# Patient Record
Sex: Female | Born: 1980 | Race: White | Hispanic: No | Marital: Married | Smoking: Former smoker
Health system: Southern US, Community
[De-identification: ages and names within clinical notes are randomized; demographics above are authoritative.]

## PROBLEM LIST (undated history)

## (undated) ENCOUNTER — Inpatient Hospital Stay (HOSPITAL_COMMUNITY): Payer: Self-pay

## (undated) DIAGNOSIS — D649 Anemia, unspecified: Secondary | ICD-10-CM

## (undated) DIAGNOSIS — N83209 Unspecified ovarian cyst, unspecified side: Secondary | ICD-10-CM

## (undated) DIAGNOSIS — N809 Endometriosis, unspecified: Secondary | ICD-10-CM

## (undated) DIAGNOSIS — Z9889 Other specified postprocedural states: Secondary | ICD-10-CM

## (undated) DIAGNOSIS — T4145XA Adverse effect of unspecified anesthetic, initial encounter: Secondary | ICD-10-CM

## (undated) DIAGNOSIS — F32A Depression, unspecified: Secondary | ICD-10-CM

## (undated) DIAGNOSIS — F329 Major depressive disorder, single episode, unspecified: Secondary | ICD-10-CM

## (undated) DIAGNOSIS — E063 Autoimmune thyroiditis: Secondary | ICD-10-CM

## (undated) DIAGNOSIS — T8859XA Other complications of anesthesia, initial encounter: Secondary | ICD-10-CM

## (undated) DIAGNOSIS — K900 Celiac disease: Secondary | ICD-10-CM

## (undated) DIAGNOSIS — B999 Unspecified infectious disease: Secondary | ICD-10-CM

## (undated) DIAGNOSIS — R809 Proteinuria, unspecified: Secondary | ICD-10-CM

## (undated) DIAGNOSIS — N209 Urinary calculus, unspecified: Secondary | ICD-10-CM

## (undated) DIAGNOSIS — R112 Nausea with vomiting, unspecified: Secondary | ICD-10-CM

## (undated) DIAGNOSIS — R87629 Unspecified abnormal cytological findings in specimens from vagina: Secondary | ICD-10-CM

## (undated) DIAGNOSIS — R51 Headache: Secondary | ICD-10-CM

## (undated) DIAGNOSIS — E0590 Thyrotoxicosis, unspecified without thyrotoxic crisis or storm: Secondary | ICD-10-CM

## (undated) DIAGNOSIS — N200 Calculus of kidney: Secondary | ICD-10-CM

## (undated) HISTORY — PX: NASAL SINUS SURGERY: SHX719

## (undated) HISTORY — DX: Headache: R51

## (undated) HISTORY — PX: TONSILLECTOMY: SUR1361

## (undated) HISTORY — PX: LAPAROSCOPIC ENDOMETRIOSIS FULGURATION: SUR769

## (undated) HISTORY — DX: Urinary calculus, unspecified: N20.9

## (undated) HISTORY — DX: Proteinuria, unspecified: R80.9

---

## 2008-02-29 ENCOUNTER — Encounter: Admission: RE | Admit: 2008-02-29 | Discharge: 2008-02-29 | Payer: Self-pay | Admitting: Emergency Medicine

## 2008-03-31 DIAGNOSIS — R809 Proteinuria, unspecified: Secondary | ICD-10-CM

## 2008-03-31 HISTORY — DX: Proteinuria, unspecified: R80.9

## 2008-06-07 ENCOUNTER — Encounter: Admission: RE | Admit: 2008-06-07 | Discharge: 2008-06-07 | Payer: Self-pay | Admitting: Emergency Medicine

## 2008-07-27 ENCOUNTER — Encounter (INDEPENDENT_AMBULATORY_CARE_PROVIDER_SITE_OTHER): Payer: Self-pay | Admitting: *Deleted

## 2009-02-12 ENCOUNTER — Inpatient Hospital Stay (HOSPITAL_COMMUNITY): Admission: AD | Admit: 2009-02-12 | Discharge: 2009-02-13 | Payer: Self-pay | Admitting: Obstetrics and Gynecology

## 2009-03-20 ENCOUNTER — Inpatient Hospital Stay (HOSPITAL_COMMUNITY): Admission: AD | Admit: 2009-03-20 | Discharge: 2009-03-20 | Payer: Self-pay | Admitting: Obstetrics and Gynecology

## 2009-03-28 ENCOUNTER — Ambulatory Visit (HOSPITAL_COMMUNITY): Admission: RE | Admit: 2009-03-28 | Discharge: 2009-03-28 | Payer: Self-pay | Admitting: Obstetrics and Gynecology

## 2009-04-18 ENCOUNTER — Inpatient Hospital Stay (HOSPITAL_COMMUNITY): Admission: AD | Admit: 2009-04-18 | Discharge: 2009-04-18 | Payer: Self-pay | Admitting: Obstetrics and Gynecology

## 2009-04-30 ENCOUNTER — Inpatient Hospital Stay (HOSPITAL_COMMUNITY): Admission: AD | Admit: 2009-04-30 | Discharge: 2009-04-30 | Payer: Self-pay | Admitting: Obstetrics and Gynecology

## 2009-05-14 ENCOUNTER — Inpatient Hospital Stay (HOSPITAL_COMMUNITY): Admission: AD | Admit: 2009-05-14 | Discharge: 2009-05-19 | Payer: Self-pay | Admitting: Obstetrics and Gynecology

## 2009-08-20 ENCOUNTER — Encounter: Admission: RE | Admit: 2009-08-20 | Discharge: 2009-09-19 | Payer: Self-pay | Admitting: Obstetrics and Gynecology

## 2009-09-20 ENCOUNTER — Encounter: Admission: RE | Admit: 2009-09-20 | Discharge: 2009-09-27 | Payer: Self-pay | Admitting: Obstetrics and Gynecology

## 2009-10-21 ENCOUNTER — Encounter: Admission: RE | Admit: 2009-10-21 | Discharge: 2009-11-20 | Payer: Self-pay | Admitting: Obstetrics and Gynecology

## 2009-11-21 ENCOUNTER — Encounter: Admission: RE | Admit: 2009-11-21 | Discharge: 2009-12-21 | Payer: Self-pay | Admitting: Obstetrics and Gynecology

## 2009-12-22 ENCOUNTER — Encounter: Admission: RE | Admit: 2009-12-22 | Discharge: 2010-01-21 | Payer: Self-pay | Admitting: Obstetrics and Gynecology

## 2010-01-22 ENCOUNTER — Encounter
Admission: RE | Admit: 2010-01-22 | Discharge: 2010-02-21 | Payer: Self-pay | Source: Home / Self Care | Admitting: Obstetrics and Gynecology

## 2010-02-22 ENCOUNTER — Encounter
Admission: RE | Admit: 2010-02-22 | Discharge: 2010-03-24 | Payer: Self-pay | Source: Home / Self Care | Attending: Obstetrics and Gynecology | Admitting: Obstetrics and Gynecology

## 2010-03-07 ENCOUNTER — Inpatient Hospital Stay (HOSPITAL_COMMUNITY): Admission: AD | Admit: 2010-03-07 | Discharge: 2009-04-03 | Payer: Self-pay | Admitting: Obstetrics and Gynecology

## 2010-03-25 ENCOUNTER — Encounter
Admission: RE | Admit: 2010-03-25 | Discharge: 2010-04-24 | Payer: Self-pay | Source: Home / Self Care | Attending: Obstetrics and Gynecology | Admitting: Obstetrics and Gynecology

## 2010-04-25 ENCOUNTER — Encounter
Admission: RE | Admit: 2010-04-25 | Discharge: 2010-04-30 | Payer: Self-pay | Source: Home / Self Care | Attending: Obstetrics and Gynecology | Admitting: Obstetrics and Gynecology

## 2010-05-26 ENCOUNTER — Encounter (HOSPITAL_COMMUNITY)
Admission: RE | Admit: 2010-05-26 | Discharge: 2010-05-26 | Disposition: A | Discharge status: Home / Self Care | Payer: Self-pay | Source: Ambulatory Visit | Attending: Obstetrics and Gynecology | Admitting: Obstetrics and Gynecology

## 2010-05-26 DIAGNOSIS — O923 Agalactia: Secondary | ICD-10-CM | POA: Insufficient documentation

## 2010-06-16 LAB — URINALYSIS, ROUTINE W REFLEX MICROSCOPIC
Bilirubin Urine: NEGATIVE
Bilirubin Urine: NEGATIVE
Glucose, UA: NEGATIVE mg/dL
Hgb urine dipstick: NEGATIVE
Ketones, ur: NEGATIVE mg/dL
Leukocytes, UA: NEGATIVE
Nitrite: NEGATIVE
Nitrite: NEGATIVE
Specific Gravity, Urine: 1.01 (ref 1.005–1.030)
Urobilinogen, UA: 0.2 mg/dL (ref 0.0–1.0)

## 2010-06-16 LAB — COMPREHENSIVE METABOLIC PANEL
ALT: 14 U/L (ref 0–35)
AST: 24 U/L (ref 0–37)
AST: 25 U/L (ref 0–37)
Albumin: 2.2 g/dL — ABNORMAL LOW (ref 3.5–5.2)
Albumin: 2.4 g/dL — ABNORMAL LOW (ref 3.5–5.2)
Alkaline Phosphatase: 142 U/L — ABNORMAL HIGH (ref 39–117)
BUN: 5 mg/dL — ABNORMAL LOW (ref 6–23)
CO2: 21 mEq/L (ref 19–32)
Chloride: 109 mEq/L (ref 96–112)
Creatinine, Ser: 0.49 mg/dL (ref 0.4–1.2)
Potassium: 3.7 mEq/L (ref 3.5–5.1)
Sodium: 134 mEq/L — ABNORMAL LOW (ref 135–145)
Sodium: 137 mEq/L (ref 135–145)
Total Bilirubin: 0.2 mg/dL — ABNORMAL LOW (ref 0.3–1.2)
Total Bilirubin: 0.5 mg/dL (ref 0.3–1.2)

## 2010-06-16 LAB — CBC
HCT: 30.3 % — ABNORMAL LOW (ref 36.0–46.0)
HCT: 33 % — ABNORMAL LOW (ref 36.0–46.0)
Hemoglobin: 10.9 g/dL — ABNORMAL LOW (ref 12.0–15.0)
MCHC: 33.5 g/dL (ref 30.0–36.0)
Platelets: 223 10*3/uL (ref 150–400)
Platelets: 260 10*3/uL (ref 150–400)
RDW: 13.3 % (ref 11.5–15.5)
RDW: 13.6 % (ref 11.5–15.5)
WBC: 10.3 10*3/uL (ref 4.0–10.5)

## 2010-06-16 LAB — WET PREP, GENITAL

## 2010-06-16 LAB — URINE MICROSCOPIC-ADD ON

## 2010-06-19 LAB — CBC
HCT: 24.3 % — ABNORMAL LOW (ref 36.0–46.0)
HCT: 31.4 % — ABNORMAL LOW (ref 36.0–46.0)
Hemoglobin: 10.4 g/dL — ABNORMAL LOW (ref 12.0–15.0)
MCHC: 33.2 g/dL (ref 30.0–36.0)
MCV: 89.9 fL (ref 78.0–100.0)
MCV: 90 fL (ref 78.0–100.0)
Platelets: 169 10*3/uL (ref 150–400)
Platelets: 255 10*3/uL (ref 150–400)
RBC: 2.71 MIL/uL — ABNORMAL LOW (ref 3.87–5.11)
RDW: 14.5 % (ref 11.5–15.5)
WBC: 9.9 10*3/uL (ref 4.0–10.5)

## 2010-06-19 LAB — COMPREHENSIVE METABOLIC PANEL
ALT: 15 U/L (ref 0–35)
AST: 25 U/L (ref 0–37)
Calcium: 7.8 mg/dL — ABNORMAL LOW (ref 8.4–10.5)
GFR calc Af Amer: >60 mL/min (ref >60)
GFR calc non Af Amer: >60 mL/min (ref >60)
Total Bilirubin: 0.7 mg/dL (ref 0.3–1.2)

## 2010-06-19 LAB — URIC ACID: Uric Acid, Serum: 5.2 mg/dL (ref 2.4–7.0)

## 2010-06-26 ENCOUNTER — Encounter (HOSPITAL_COMMUNITY)
Admission: RE | Admit: 2010-06-26 | Discharge: 2010-06-26 | Disposition: A | Discharge status: Home / Self Care | Payer: Self-pay | Source: Ambulatory Visit | Attending: Obstetrics and Gynecology | Admitting: Obstetrics and Gynecology

## 2010-06-26 DIAGNOSIS — O923 Agalactia: Secondary | ICD-10-CM | POA: Insufficient documentation

## 2010-07-01 LAB — URIC ACID: Uric Acid, Serum: 4.3 mg/dL (ref 2.4–7.0)

## 2010-07-01 LAB — COMPREHENSIVE METABOLIC PANEL
BUN: 7 mg/dL (ref 6–23)
Calcium: 7.8 mg/dL — ABNORMAL LOW (ref 8.4–10.5)
Glucose, Bld: 103 mg/dL — ABNORMAL HIGH (ref 70–99)
Total Protein: 5.2 g/dL — ABNORMAL LOW (ref 6.0–8.3)

## 2010-07-01 LAB — CBC
HCT: 33.3 % — ABNORMAL LOW (ref 36.0–46.0)
Hemoglobin: 11.3 g/dL — ABNORMAL LOW (ref 12.0–15.0)
MCHC: 33.8 g/dL (ref 30.0–36.0)
RDW: 12.3 % (ref 11.5–15.5)

## 2010-07-01 LAB — LACTATE DEHYDROGENASE: LDH: 139 U/L (ref 94–250)

## 2010-07-03 LAB — URINALYSIS, ROUTINE W REFLEX MICROSCOPIC
Glucose, UA: NEGATIVE mg/dL
Specific Gravity, Urine: 1.02 (ref 1.005–1.030)
Urobilinogen, UA: 0.2 mg/dL (ref 0.0–1.0)

## 2010-07-03 LAB — URINE MICROSCOPIC-ADD ON

## 2010-07-03 LAB — CBC
Hemoglobin: 9.8 g/dL — ABNORMAL LOW (ref 12.0–15.0)
MCV: 96.2 fL (ref 78.0–100.0)
Platelets: 235 10*3/uL (ref 150–400)
RBC: 2.99 MIL/uL — ABNORMAL LOW (ref 3.87–5.11)
WBC: 9.3 10*3/uL (ref 4.0–10.5)
WBC: 14.9 10*3/uL — ABNORMAL HIGH (ref 4.0–10.5)

## 2010-07-03 LAB — COMPREHENSIVE METABOLIC PANEL
AST: 25 U/L (ref 0–37)
Albumin: 2.7 g/dL — ABNORMAL LOW (ref 3.5–5.2)
Chloride: 109 mEq/L (ref 96–112)
Creatinine, Ser: 0.57 mg/dL (ref 0.4–1.2)
GFR calc Af Amer: >60 mL/min (ref >60)
Total Bilirubin: 0.3 mg/dL (ref 0.3–1.2)
Total Protein: 5.4 g/dL — ABNORMAL LOW (ref 6.0–8.3)

## 2010-07-27 ENCOUNTER — Encounter (HOSPITAL_COMMUNITY)
Admission: RE | Admit: 2010-07-27 | Discharge: 2010-07-27 | Disposition: A | Discharge status: Home / Self Care | Payer: Self-pay | Source: Ambulatory Visit | Attending: Obstetrics and Gynecology | Admitting: Obstetrics and Gynecology

## 2010-07-27 DIAGNOSIS — O923 Agalactia: Secondary | ICD-10-CM | POA: Insufficient documentation

## 2010-08-27 ENCOUNTER — Encounter (HOSPITAL_COMMUNITY)
Admission: RE | Admit: 2010-08-27 | Discharge: 2010-08-27 | Disposition: A | Discharge status: Home / Self Care | Payer: Self-pay | Source: Ambulatory Visit | Attending: Obstetrics and Gynecology | Admitting: Obstetrics and Gynecology

## 2010-08-27 DIAGNOSIS — O923 Agalactia: Secondary | ICD-10-CM | POA: Insufficient documentation

## 2010-09-27 ENCOUNTER — Encounter (HOSPITAL_COMMUNITY)
Admission: RE | Admit: 2010-09-27 | Discharge: 2010-09-27 | Disposition: A | Discharge status: Home / Self Care | Payer: Self-pay | Source: Ambulatory Visit | Attending: Obstetrics and Gynecology | Admitting: Obstetrics and Gynecology

## 2010-09-27 DIAGNOSIS — O923 Agalactia: Secondary | ICD-10-CM | POA: Insufficient documentation

## 2010-10-27 ENCOUNTER — Inpatient Hospital Stay (HOSPITAL_COMMUNITY): Payer: BC Managed Care – PPO

## 2010-10-27 ENCOUNTER — Other Ambulatory Visit: Payer: Self-pay | Admitting: Obstetrics and Gynecology

## 2010-10-27 ENCOUNTER — Inpatient Hospital Stay (HOSPITAL_COMMUNITY)
Admission: AD | Admit: 2010-10-27 | Discharge: 2010-10-27 | Disposition: A | Discharge status: Home / Self Care | Payer: BC Managed Care – PPO | Source: Ambulatory Visit | Attending: Obstetrics and Gynecology | Admitting: Obstetrics and Gynecology

## 2010-10-27 DIAGNOSIS — N949 Unspecified condition associated with female genital organs and menstrual cycle: Secondary | ICD-10-CM | POA: Insufficient documentation

## 2010-10-27 DIAGNOSIS — R1110 Vomiting, unspecified: Secondary | ICD-10-CM | POA: Insufficient documentation

## 2010-10-27 DIAGNOSIS — R112 Nausea with vomiting, unspecified: Secondary | ICD-10-CM | POA: Insufficient documentation

## 2010-10-27 DIAGNOSIS — M545 Low back pain, unspecified: Secondary | ICD-10-CM | POA: Insufficient documentation

## 2010-10-27 DIAGNOSIS — R6883 Chills (without fever): Secondary | ICD-10-CM | POA: Insufficient documentation

## 2010-10-27 DIAGNOSIS — R102 Pelvic and perineal pain: Secondary | ICD-10-CM

## 2010-10-27 DIAGNOSIS — R231 Pallor: Secondary | ICD-10-CM | POA: Insufficient documentation

## 2010-10-27 DIAGNOSIS — R61 Generalized hyperhidrosis: Secondary | ICD-10-CM | POA: Insufficient documentation

## 2010-10-27 LAB — COMPREHENSIVE METABOLIC PANEL
ALT: 14 U/L (ref 0–35)
Alkaline Phosphatase: 57 U/L (ref 39–117)
BUN: 10 mg/dL (ref 6–23)
CO2: 27 mEq/L (ref 19–32)
Calcium: 9 mg/dL (ref 8.4–10.5)
GFR calc Af Amer: >60 mL/min (ref >60)
GFR calc non Af Amer: >60 mL/min (ref >60)
Glucose, Bld: 199 mg/dL — ABNORMAL HIGH (ref 70–99)
Potassium: 3.4 mEq/L — ABNORMAL LOW (ref 3.5–5.1)
Total Protein: 6.3 g/dL (ref 6.0–8.3)

## 2010-10-27 LAB — DIFFERENTIAL
Eosinophils Absolute: 0 10*3/uL (ref 0.0–0.7)
Eosinophils Relative: 0 % (ref 0–5)
Lymphocytes Relative: 10 % — ABNORMAL LOW (ref 12–46)
Lymphs Abs: 0.6 10*3/uL — ABNORMAL LOW (ref 0.7–4.0)
Monocytes Relative: 10 % (ref 3–12)

## 2010-10-27 LAB — CBC
Hemoglobin: 12.5 g/dL (ref 12.0–15.0)
MCH: 30.9 pg (ref 26.0–34.0)
MCV: 90.1 fL (ref 78.0–100.0)
RBC: 4.05 MIL/uL (ref 3.87–5.11)
WBC: 5.6 10*3/uL (ref 4.0–10.5)

## 2010-10-27 LAB — URINALYSIS, ROUTINE W REFLEX MICROSCOPIC
Bilirubin Urine: NEGATIVE
Glucose, UA: NEGATIVE mg/dL
Hgb urine dipstick: NEGATIVE
Specific Gravity, Urine: 1.02 (ref 1.005–1.030)
pH: 8 (ref 5.0–8.0)

## 2010-10-27 LAB — WET PREP, GENITAL
Clue Cells Wet Prep HPF POC: NONE SEEN
Trich, Wet Prep: NONE SEEN
Yeast Wet Prep HPF POC: NONE SEEN

## 2010-10-27 LAB — LIPASE, BLOOD: Lipase: 19 U/L (ref 11–59)

## 2010-10-27 LAB — AMYLASE: Amylase: 36 U/L (ref 0–105)

## 2010-10-27 MED ORDER — IOHEXOL 300 MG/ML  SOLN
100.0000 mL | Freq: Once | INTRAMUSCULAR | Status: AC | PRN
  Administered 2010-10-27: 100 mL via INTRAVENOUS

## 2010-10-27 MED ORDER — HYDROMORPHONE HCL 1 MG/ML IJ SOLN
0.6000 mg | INTRAMUSCULAR | Status: DC | PRN
  Administered 2010-10-27: 0.6 mg via INTRAVENOUS
  Administered 2010-10-27: 17:00:00 via INTRAVENOUS
  Filled 2010-10-27 (×2): qty 1

## 2010-10-27 MED ORDER — PROMETHAZINE HCL 25 MG PO TABS: 25.0000 mg | ORAL_TABLET | Freq: Four times a day (QID) | ORAL | Status: AC | PRN

## 2010-10-27 MED ORDER — ONDANSETRON HCL 4 MG/2ML IJ SOLN
4.0000 mg | Freq: Once | INTRAMUSCULAR | Status: AC
  Administered 2010-10-27: 4 mg via INTRAVENOUS
  Filled 2010-10-27: qty 2

## 2010-10-27 MED ORDER — PROMETHAZINE HCL 25 MG/ML IJ SOLN
25.0000 mg | INTRAMUSCULAR | Status: DC | PRN
  Administered 2010-10-27: 25 mg via INTRAVENOUS
  Filled 2010-10-27: qty 1

## 2010-10-27 MED ORDER — MORPHINE SULFATE 4 MG/ML IJ SOLN: 4.0000 mg | INTRAMUSCULAR | Status: DC | PRN

## 2010-10-27 MED ORDER — SODIUM CHLORIDE 0.9 % IV SOLN
INTRAVENOUS | Status: DC
  Administered 2010-10-27: 19:00:00 via INTRAVENOUS

## 2010-10-27 MED ORDER — METOCLOPRAMIDE HCL 5 MG/ML IJ SOLN
10.0000 mg | Freq: Four times a day (QID) | INTRAMUSCULAR | Status: DC | PRN
  Administered 2010-10-27: 10 mg via INTRAVENOUS
  Filled 2010-10-27: qty 2

## 2010-10-27 MED ORDER — DEXTROSE IN LACTATED RINGERS 5 % IV SOLN
INTRAVENOUS | Status: DC
  Administered 2010-10-27 (×2): via INTRAVENOUS

## 2010-10-27 NOTE — Progress Notes (Signed)
Dr. Su Hilt notified of pt wish to be dc'd home instead of transferred to [facility].  md to put dc orders in.

## 2010-10-27 NOTE — Progress Notes (Signed)
Dr. Su Hilt notified of CT scan results.  Notified of pt nausea relief with phenergan.  md requests to ask pt if she wishes to be transferred to [facility] to be evaluated there or to be dc'd home.

## 2010-10-27 NOTE — Progress Notes (Signed)
Pt back from CT.  Taken to bathroom.  Back in bed.  States nausea is better since phenergan.

## 2010-10-27 NOTE — ED Provider Notes (Signed)
History     Chief Complaint  Patient presents with  . Back Pain  . Emesis   HPI Comments: Pt states severe lower mid back pain started last night, along with N/V. Pt states she's not had pain like this before. Pt states it also radiates to the lower pelvic region. Pt is concerned that she has a kidney stone which she disclosed following ultrasound which was negative for gyn pathology.  I discussed options and patient would like to proceed with a CT Scan for further eval.  Back Pain This is a new problem. The current episode started yesterday. The problem occurs constantly. The problem is unchanged. The pain is present in the lumbar spine. The quality of the pain is described as aching and cramping. The pain is at a severity of 8/10. The pain is moderate. The pain is the same all the time. Associated symptoms include pelvic pain.  Emesis  Associated symptoms include chills.    OB History    No data available    Pt is a G1P1, hx of C/S.  HX of endometriosis - lap surgery in 2000  No past medical history on file. Kidney stones in pregnancy, proteinuria during pregnancy  No past surgical history on file.lap surgery for endometriosis in 2000 Tonsillectomy in 2002 Sinus surgery in 2008 Cesarean section in 2011  No family history on file.  History  Substance Use Topics  . Smoking status: Not on file  . Smokeless tobacco: Not on file  . Alcohol Use: Not on file    Allergies:  Allergies  Allergen Reactions  . Augmentin (Amoxicillin-Pot Clavulanate) Other (See Comments)    Patient says Augmentin makes her violently ill.  . Eggs Or Egg-Derived Products Nausea And Vomiting  . Wheat Other (See Comments)    Patient states it makes her joints hurt and gives her migraines  . Morphine And Related Rash  . Sulfa Antibiotics Rash    Prescriptions prior to admission  Medication Sig Dispense Refill  . acetaminophen (TYLENOL) 500 MG tablet Take 500 mg by mouth every 6 (six) hours as  needed.        Marland Kitchen azithromycin (ZITHROMAX) 250 MG tablet Take 250 mg by mouth daily. Patient finished course on 10-26-10       . ibuprofen (ADVIL,MOTRIN) 200 MG tablet Take 200 mg by mouth every 6 (six) hours as needed. For pain       . prenatal vitamin w/FE, FA (PRENATAL 1 + 1) 27-1 MG TABS Take 1 tablet by mouth daily.          Review of Systems  Constitutional: Positive for chills and diaphoresis.  Gastrointestinal: Positive for nausea and vomiting.  Genitourinary: Positive for pelvic pain.  Musculoskeletal: Positive for back pain.   Physical Exam   Blood pressure 107/56, pulse 66, temperature 98.7 F (37.1 C), temperature source Oral, resp. rate 18, height 5\' 9"  (1.753 m), weight 55.792 kg (123 lb), last menstrual period 10/15/2010.  Physical Exam  Constitutional: She is oriented to person, place, and time. Vital signs are normal. She appears well-developed and well-nourished. She appears distressed.  Cardiovascular: Normal rate and regular rhythm.   Respiratory: Effort normal and breath sounds normal.  GI: Soft. There is tenderness in the left lower quadrant. There is no CVA tenderness.  Genitourinary: Vagina normal and uterus normal. Cervix exhibits no motion tenderness. Left adnexum displays tenderness. No vaginal discharge found.  Musculoskeletal: Normal range of motion.  Neurological: She is alert and oriented to person,  place, and time.  Skin: She is diaphoretic. There is pallor.  Psychiatric: She has a normal mood and affect. Her behavior is normal.    MAU Course  Procedures IVF's and dilaudid, zofran, labs, UPT, pelvic US, wet prep, GC/CT cx  Assessment and Plan  Neg UPT Neg wet prep Pelvic US - WNL Pain improved after dilaudid Nausea persists, will try reglan CT Scan pending  D/W Dr Imagene Gurney 10/27/2010, 3:21 PM

## 2010-10-27 NOTE — Progress Notes (Signed)
Pt reports having sever lower back pain ans N/V. Reports some numbness in the left legs. Pt feel weak and dizzy pt has been trying tio get pregnant and not sure

## 2010-10-27 NOTE — Progress Notes (Signed)
Discussed with pt option of being transferred to [facility] or being discharged home.  Pt wishes to be discharged home.

## 2010-10-28 LAB — GC/CHLAMYDIA PROBE AMP, GENITAL
Chlamydia, DNA Probe: NEGATIVE
GC Probe Amp, Genital: NEGATIVE

## 2011-01-09 IMAGING — US US ABDOMEN COMPLETE
1 series · 14 of 25 positions shown · non-contrast
Comparison: None.

CLINICAL DATA: Pregnant patient with right upper quadrant pain

ABDOMINAL ULTRASOUND COMPLETE

[Series 1: us abdomen complete · 14 of 77 slices shown]
[im 1/77]
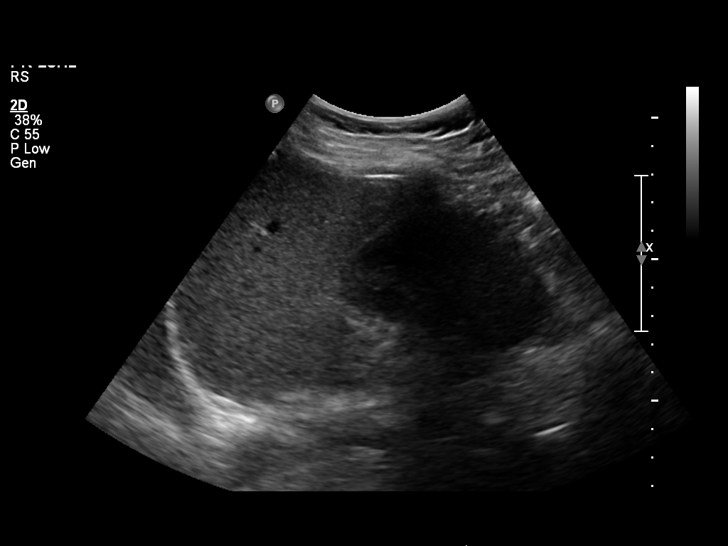
[im 7/77]
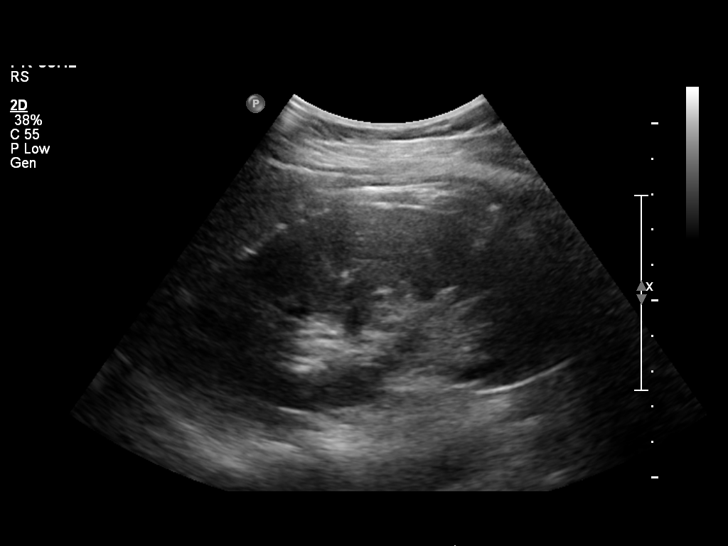
[im 13/77]
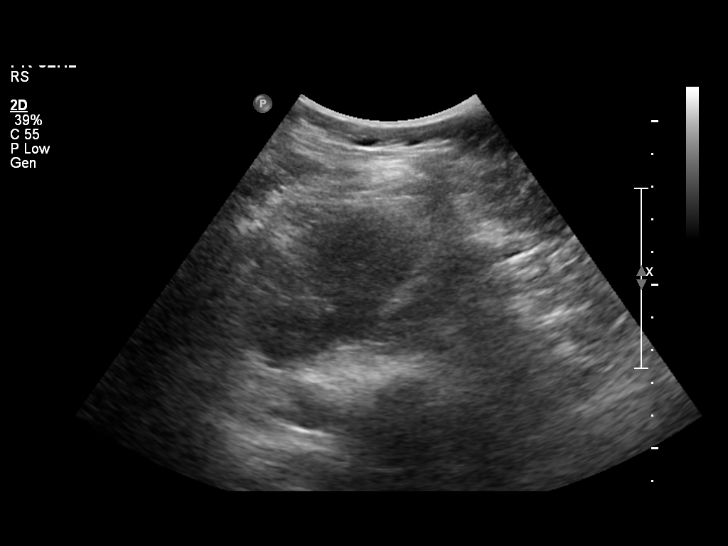
[im 20/77]
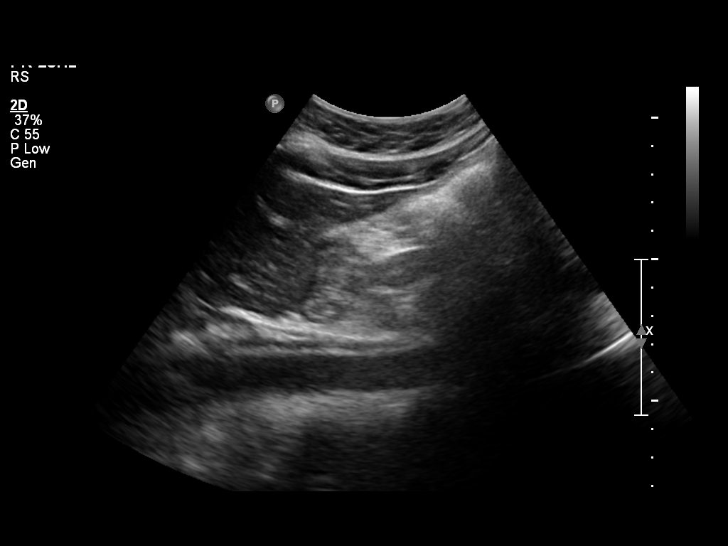
[im 26/77]
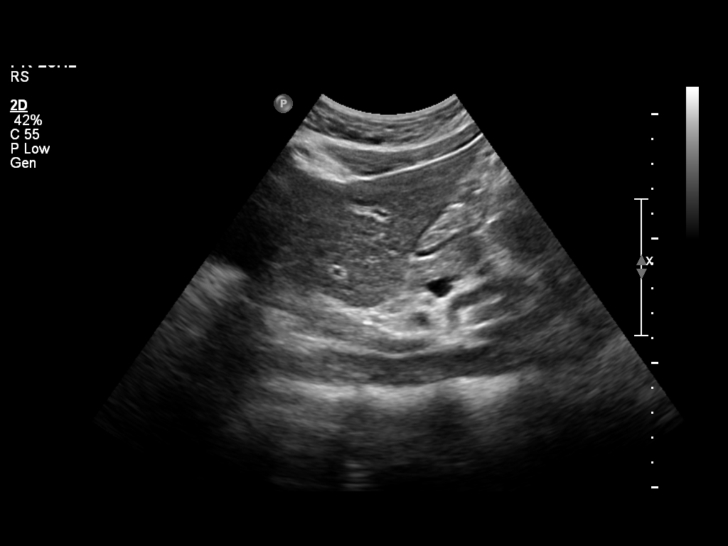
[im 29/77]
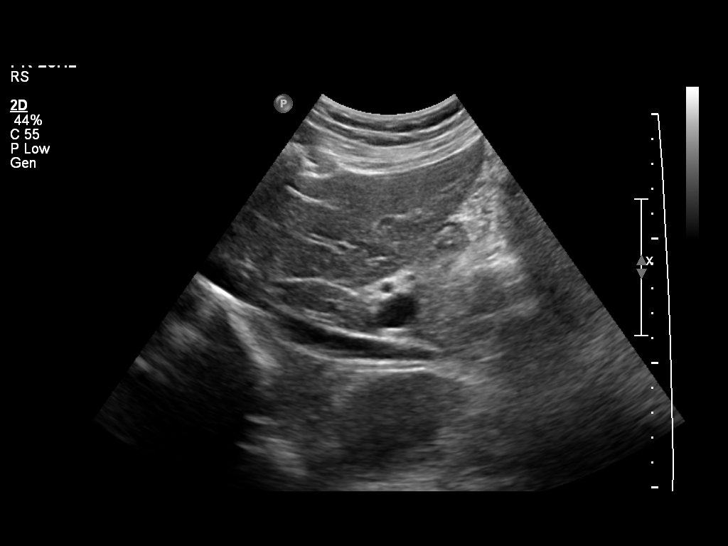
[im 35/77]
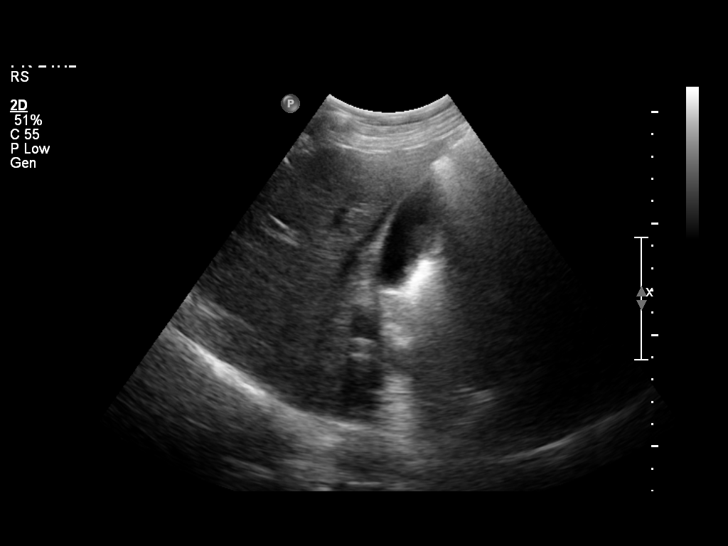
[im 42/77]
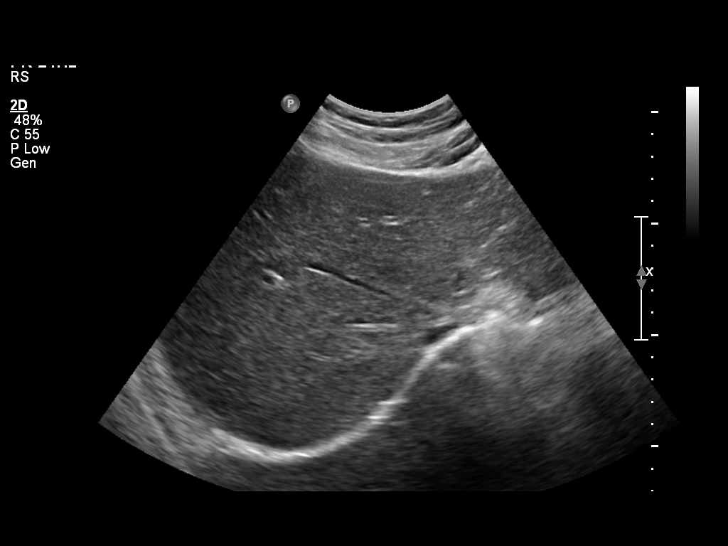
[im 48/77]
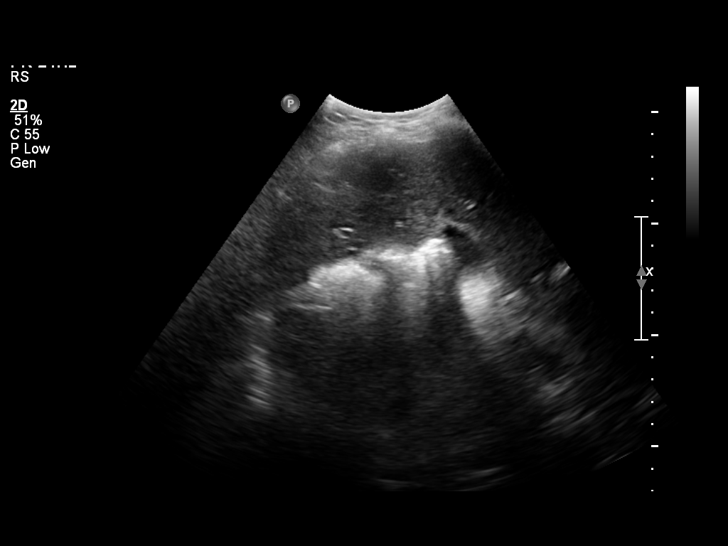
[im 51/77]
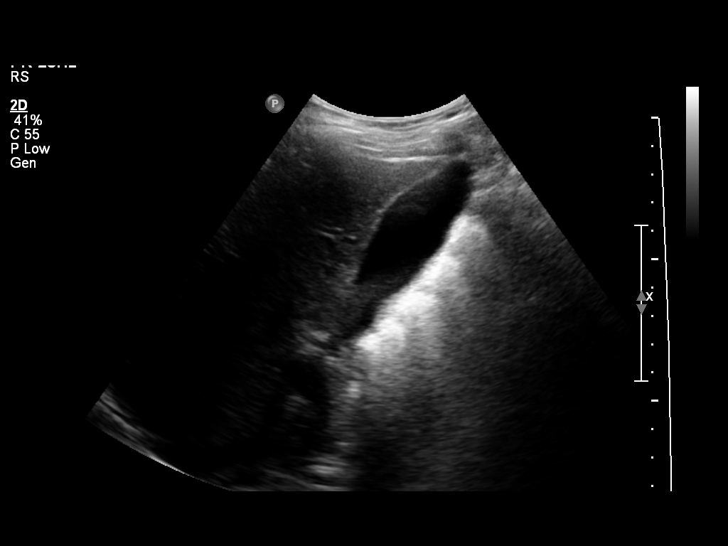
[im 58/77]
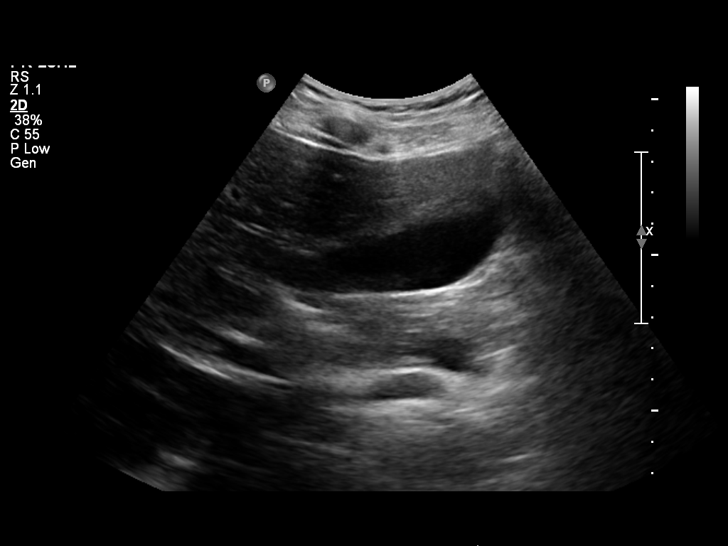
[im 64/77]
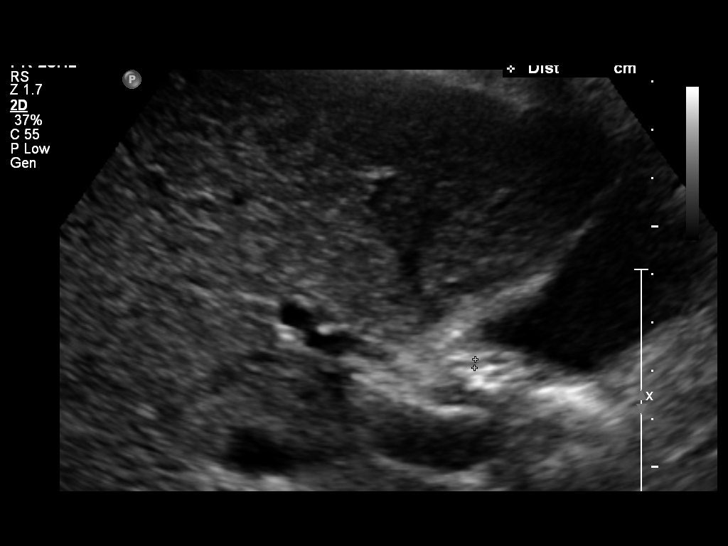
[im 70/77]
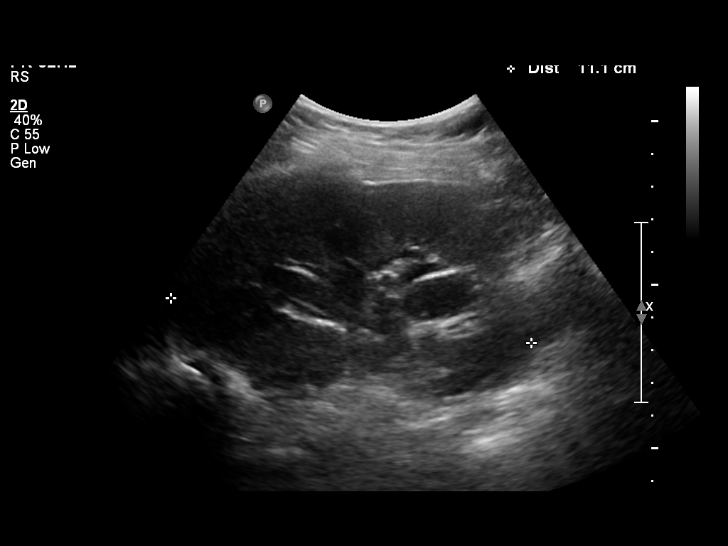
[im 77/77]
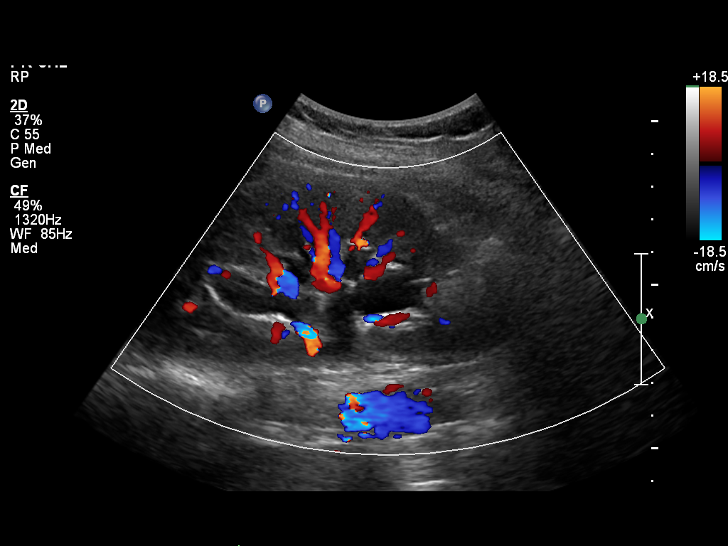

[14 of 25 positions shown; findings below may reference images not displayed]

FINDINGS: Gallbladder:  No gallstones, gallbladder wall thickening, or
pericholecystic fluid.

Common Bile Duct:  Within normal limits in caliber.

Liver:  No focal lesion identified.  Within normal limits in
parenchymal echogenicity.

IVC:  Appears normal.

Pancreas:  Although the pancreas is difficult to visualize in its
entirety, no focal pancreatic abnormality is identified.

Spleen:  Within normal limits in size and echotexture.

Right kidney:  Normal in size and parenchymal echogenicity.  No
evidence of mass.  There is mild hydronephrosis.

Left kidney:  Normal in size and parenchymal echogenicity.  No
evidence of mass or hydronephrosis.

Abdominal Aorta:  No aneurysm identified.
IMPRESSION: There is mild right hydronephrosis which is not an uncommon finding
in pregnancy.

## 2011-02-11 LAB — OB RESULTS CONSOLE RUBELLA ANTIBODY, IGM: Rubella: IMMUNE

## 2011-02-11 LAB — OB RESULTS CONSOLE HEPATITIS B SURFACE ANTIGEN: Hepatitis B Surface Ag: NEGATIVE

## 2011-02-11 LAB — OB RESULTS CONSOLE ABO/RH

## 2011-02-11 LAB — OB RESULTS CONSOLE ANTIBODY SCREEN: Antibody Screen: NEGATIVE

## 2011-03-09 ENCOUNTER — Inpatient Hospital Stay (HOSPITAL_COMMUNITY): Payer: BC Managed Care – PPO

## 2011-03-09 ENCOUNTER — Inpatient Hospital Stay (HOSPITAL_COMMUNITY)
Admission: AD | Admit: 2011-03-09 | Discharge: 2011-03-09 | Disposition: A | Discharge status: Home / Self Care | Payer: BC Managed Care – PPO | Source: Ambulatory Visit | Attending: Obstetrics and Gynecology | Admitting: Obstetrics and Gynecology

## 2011-03-09 ENCOUNTER — Encounter (HOSPITAL_COMMUNITY): Payer: Self-pay

## 2011-03-09 DIAGNOSIS — O209 Hemorrhage in early pregnancy, unspecified: Secondary | ICD-10-CM

## 2011-03-09 DIAGNOSIS — R1031 Right lower quadrant pain: Secondary | ICD-10-CM | POA: Insufficient documentation

## 2011-03-09 DIAGNOSIS — O26859 Spotting complicating pregnancy, unspecified trimester: Secondary | ICD-10-CM | POA: Insufficient documentation

## 2011-03-09 HISTORY — DX: Celiac disease: K90.0

## 2011-03-09 HISTORY — DX: Other complications of anesthesia, initial encounter: T88.59XA

## 2011-03-09 HISTORY — DX: Adverse effect of unspecified anesthetic, initial encounter: T41.45XA

## 2011-03-09 HISTORY — DX: Autoimmune thyroiditis: E06.3

## 2011-03-09 LAB — URINALYSIS, ROUTINE W REFLEX MICROSCOPIC
Hgb urine dipstick: NEGATIVE
Nitrite: NEGATIVE
Protein, ur: NEGATIVE mg/dL
Specific Gravity, Urine: 1.01 (ref 1.005–1.030)
Urobilinogen, UA: 0.2 mg/dL (ref 0.0–1.0)

## 2011-03-09 LAB — URINE MICROSCOPIC-ADD ON

## 2011-03-09 LAB — WET PREP, GENITAL

## 2011-03-09 NOTE — ED Provider Notes (Signed)
History   Alexandria Schneider is a 30 y.o MWF who presents at 16.3 weeks w/ CC of brown/pink spotting w/ wiping today and RLQ cramping/"stabbing" pain earlier today.  Pt asked to to 600mg  Motrin po before coming for eval, and that relieved abdominal pain.  No IC since conception.  Denies, Resp or GI c/o's.  No other incidents of VB during this pregnancy.  No fever, chills, malaise, abnl d/c, LOF, UTI or PIH s/s.  Hx r/f:  1.  Celiac dz  2.  Previous c/s  3.  H/o proteinuria last pregnancy  4.  H/o kidney stones  5.  H/o Hashimoto's--labs stable & on no current meds  6.  Multiple food and antibiotic allergies  7.  H/o abnl pap  8.  H/o endometriosis  9.  H/o depression--no current   Chief Complaint  Patient presents with  . Vaginal Bleeding  . Abdominal Cramping   HPI  OB History    Grav Para Term Preterm Abortions TAB SAB Ect Mult Living   3 1 1  0 1 0 1 0 0 1      Past Medical History  Diagnosis Date  . Celiac disease   . Hashimoto disease   . Complication of anesthesia     slow to wake up    Past Surgical History  Procedure Date  . Laparoscopic endometriosis fulguration   . Tonsillectomy   . Nasal sinus surgery   . Cesarean section     Family History  Problem Relation Age of Onset  . Anesthesia problems Neg Hx   . Hypotension Neg Hx   . Malignant hyperthermia Neg Hx   . Pseudochol deficiency Neg Hx     History  Substance Use Topics  . Smoking status: Former Smoker    Quit date: 02/28/2005  . Smokeless tobacco: Not on file  . Alcohol Use: No    Allergies:  Allergies  Allergen Reactions  . Augmentin (Amoxicillin-Pot Clavulanate) Other (See Comments)    Patient says Augmentin makes her violently ill.  . Eggs Or Egg-Derived Products Nausea And Vomiting  . Wheat Other (See Comments)    Patient states it makes her joints hurt and gives her migraines  . Morphine And Related Rash  . Sulfa Antibiotics Rash    Prescriptions prior to admission  Medication Sig Dispense  Refill  . acetaminophen (TYLENOL) 500 MG tablet Take 1,000 mg by mouth every 6 (six) hours as needed.       Marland Kitchen ibuprofen (ADVIL,MOTRIN) 200 MG tablet Take 600 mg by mouth every 6 (six) hours as needed. For pain      . prenatal vitamin w/FE, FA (PRENATAL 1 + 1) 27-1 MG TABS Take 1 tablet by mouth daily.          ROS--see HPI Physical Exam   Blood pressure 103/63, pulse 69, temperature 99 F (37.2 C), resp. rate 16, height 5\' 9"  (1.753 m), weight 61.417 kg (135 lb 6.4 oz), last menstrual period 10/15/2010. .. Results for orders placed during the hospital encounter of 03/09/11 (from the past 24 hour(s))  URINALYSIS, ROUTINE W REFLEX MICROSCOPIC     Status: Abnormal   Collection Time   03/09/11  2:20 PM      Component Value Range   Color, Urine YELLOW  YELLOW    APPearance HAZY (*) CLEAR    Specific Gravity, Urine 1.010  1.005 - 1.030    pH 7.0  5.0 - 8.0    Glucose, UA NEGATIVE  NEGATIVE (mg/dL)  Hgb urine dipstick NEGATIVE  NEGATIVE    Bilirubin Urine NEGATIVE  NEGATIVE    Ketones, ur NEGATIVE  NEGATIVE (mg/dL)   Protein, ur NEGATIVE  NEGATIVE (mg/dL)   Urobilinogen, UA 0.2  0.0 - 1.0 (mg/dL)   Nitrite NEGATIVE  NEGATIVE    Leukocytes, UA TRACE (*) NEGATIVE   URINE MICROSCOPIC-ADD ON     Status: Abnormal   Collection Time   03/09/11  2:20 PM      Component Value Range   Squamous Epithelial / LPF FEW (*) RARE    WBC, UA 0-2  <3 (WBC/hpf)   RBC / HPF 0-2  <3 (RBC/hpf)   Bacteria, UA FEW (*) RARE   WET PREP, GENITAL     Status: Abnormal   Collection Time   03/09/11  2:55 PM      Component Value Range   Yeast, Wet Prep NONE SEEN  NONE SEEN    Trich, Wet Prep NONE SEEN  NONE SEEN    Clue Cells, Wet Prep NONE SEEN  NONE SEEN    WBC, Wet Prep HPF POC FEW (*) NONE SEEN    U/S:  FHR=147, Placenta Anterior above cervical os; AFI subjectively WNL; cervical length=4 cm transabdominally; Lt ov WNL; Rt not visualized.   Physical Exam  Constitutional: She is oriented to person, place, and  time. She appears well-developed and well-nourished. No distress.  Cardiovascular: Normal rate and regular rhythm.   Respiratory: Effort normal and breath sounds normal.  GI: Soft. Bowel sounds are normal.       gravid  Genitourinary: Vagina normal.       No blood on vulva or in vault.  Small amt white/ slightly pink d/c.  Cx-long/closed.  Musculoskeletal: She exhibits no edema.  Neurological: She is alert and oriented to person, place, and time. She has normal reflexes.  Skin: Skin is warm and dry.  Psychiatric: She has a normal mood and affect. Her behavior is normal. Judgment and thought content normal.    MAU Course  Procedures 1.  SSE 2. Wet Prep 3.  U/a 4.  Limited OB u/s  Assessment and Plan  1.  IUP at 16.3 2. Spotting w/ wiping today 3.  B pos 4.  RLQ cramping, relieved w/ 600mg  Motrin po before coming to MAU 5.  Nml Wet prep and nml limited OB u/s  1.  D/c home w/ bleeding precautions; continue Motrin q 6 hr x24 hrs 2.  Keep appt 12/31, or f/u prn worsening bleeding, cramping, or other concerns 3.  Increase H2o, pelvic rest until 7days w/ no bleeding  Branae Crail H 03/09/2011, 5:12 PM

## 2011-03-09 NOTE — Progress Notes (Signed)
Onset of vaginal bleeding this morning brown in color, cramping more on right side than left, [redacted] weeks pregnant, G3P2

## 2011-05-28 ENCOUNTER — Encounter (INDEPENDENT_AMBULATORY_CARE_PROVIDER_SITE_OTHER): Payer: BC Managed Care – PPO | Admitting: Obstetrics and Gynecology

## 2011-05-28 ENCOUNTER — Other Ambulatory Visit: Payer: BC Managed Care – PPO

## 2011-05-28 DIAGNOSIS — Z331 Pregnant state, incidental: Secondary | ICD-10-CM

## 2011-06-12 ENCOUNTER — Encounter (INDEPENDENT_AMBULATORY_CARE_PROVIDER_SITE_OTHER): Payer: BC Managed Care – PPO | Admitting: Registered Nurse

## 2011-06-12 DIAGNOSIS — O368190 Decreased fetal movements, unspecified trimester, not applicable or unspecified: Secondary | ICD-10-CM

## 2011-06-12 DIAGNOSIS — Z331 Pregnant state, incidental: Secondary | ICD-10-CM

## 2011-06-16 ENCOUNTER — Encounter (INDEPENDENT_AMBULATORY_CARE_PROVIDER_SITE_OTHER): Payer: BC Managed Care – PPO | Admitting: Obstetrics and Gynecology

## 2011-06-16 DIAGNOSIS — Z331 Pregnant state, incidental: Secondary | ICD-10-CM

## 2011-06-28 ENCOUNTER — Inpatient Hospital Stay (HOSPITAL_COMMUNITY)
Admission: AD | Admit: 2011-06-28 | Discharge: 2011-06-28 | Disposition: A | Discharge status: Home / Self Care | Payer: BC Managed Care – PPO | Source: Ambulatory Visit | Attending: Obstetrics and Gynecology | Admitting: Obstetrics and Gynecology

## 2011-06-28 ENCOUNTER — Encounter (HOSPITAL_COMMUNITY): Payer: Self-pay | Admitting: Obstetrics and Gynecology

## 2011-06-28 ENCOUNTER — Inpatient Hospital Stay (HOSPITAL_COMMUNITY): Payer: BC Managed Care – PPO

## 2011-06-28 DIAGNOSIS — K900 Celiac disease: Secondary | ICD-10-CM | POA: Diagnosis present

## 2011-06-28 DIAGNOSIS — O09299 Supervision of pregnancy with other poor reproductive or obstetric history, unspecified trimester: Secondary | ICD-10-CM

## 2011-06-28 DIAGNOSIS — Z8659 Personal history of other mental and behavioral disorders: Secondary | ICD-10-CM

## 2011-06-28 DIAGNOSIS — O26859 Spotting complicating pregnancy, unspecified trimester: Secondary | ICD-10-CM

## 2011-06-28 DIAGNOSIS — Z98891 History of uterine scar from previous surgery: Secondary | ICD-10-CM

## 2011-06-28 DIAGNOSIS — O4690 Antepartum hemorrhage, unspecified, unspecified trimester: Secondary | ICD-10-CM | POA: Insufficient documentation

## 2011-06-28 DIAGNOSIS — E063 Autoimmune thyroiditis: Secondary | ICD-10-CM | POA: Diagnosis present

## 2011-06-28 DIAGNOSIS — O4700 False labor before 37 completed weeks of gestation, unspecified trimester: Secondary | ICD-10-CM

## 2011-06-28 DIAGNOSIS — J45909 Unspecified asthma, uncomplicated: Secondary | ICD-10-CM | POA: Diagnosis not present

## 2011-06-28 DIAGNOSIS — N898 Other specified noninflammatory disorders of vagina: Secondary | ICD-10-CM

## 2011-06-28 DIAGNOSIS — Z364 Encounter for antenatal screening for fetal growth retardation: Secondary | ICD-10-CM

## 2011-06-28 LAB — URINALYSIS, ROUTINE W REFLEX MICROSCOPIC
Glucose, UA: NEGATIVE mg/dL
Ketones, ur: NEGATIVE mg/dL
Leukocytes, UA: NEGATIVE
Protein, ur: NEGATIVE mg/dL
pH: 7 (ref 5.0–8.0)

## 2011-06-28 LAB — WET PREP, GENITAL
Clue Cells Wet Prep HPF POC: NONE SEEN
Trich, Wet Prep: NONE SEEN
Yeast Wet Prep HPF POC: NONE SEEN

## 2011-06-28 LAB — URINE MICROSCOPIC-ADD ON

## 2011-06-28 NOTE — ED Provider Notes (Signed)
History   31 yo G3P1011 at 32 2/7 weeks presented c/o spotting today and cramping over the last few days.  Denies recent IC or exam.  Had increased "wetness" in underwear yesterday. Reports +FM.  Has appointment at [facility] on Monday for ROB.  Pregnancy remarkable for: Hx pre-eclampsia Previous C/S due to FTP 2011--induced at 36 weeks due to pre-eclampsia Migraines Hashimoto's sx Celiac disease Asthma Hx depression  Chief Complaint  Patient presents with  . Vaginal Bleeding  . Rupture of Membranes     OB History    Grav Para Term Preterm Abortions TAB SAB Ect Mult Living   3 1 1  0 1 0 1 0 0 1      Past Medical History  Diagnosis Date  . Celiac disease   . Hashimoto disease   . Complication of anesthesia     slow to wake up    Past Surgical History  Procedure Date  . Laparoscopic endometriosis fulguration   . Tonsillectomy   . Nasal sinus surgery   . Cesarean section     Family History  Problem Relation Age of Onset  . Anesthesia problems Neg Hx   . Hypotension Neg Hx   . Malignant hyperthermia Neg Hx   . Pseudochol deficiency Neg Hx     History  Substance Use Topics  . Smoking status: Former Smoker    Quit date: 02/28/2005  . Smokeless tobacco: Not on file  . Alcohol Use: No    Allergies:  Allergies  Allergen Reactions  . Augmentin (Amoxicillin-Pot Clavulanate) Other (See Comments)    Patient says Augmentin makes her violently ill.  . Eggs Or Egg-Derived Products Nausea And Vomiting  . Wheat Other (See Comments)    Patient states it makes her joints hurt and gives her migraines  . Morphine And Related Itching  . Sulfa Antibiotics Rash    Prescriptions prior to admission  Medication Sig Dispense Refill  . acetaminophen (TYLENOL) 500 MG tablet Take 1,000 mg by mouth every 6 (six) hours as needed. For pain      . calcium carbonate (TUMS - DOSED IN MG ELEMENTAL CALCIUM) 500 MG chewable tablet Chew 1-2 tablets by mouth daily as needed. For heartburn        . Docosahexaenoic Acid (DHA OMEGA 3 PO) Take 1 tablet by mouth daily.      . prenatal vitamin w/FE, FA (PRENATAL 1 + 1) 27-1 MG TABS Take 1 tablet by mouth daily.           Physical Exam   Blood pressure 105/65, pulse 78, temperature 97.5 F (36.4 C), temperature source Oral, resp. rate 18, last menstrual period 10/15/2010.  Chest clear Heart RRR without murmur Abd gravid, NT Pelvic--sterile speculum exam.  Small amount pink spotting in otherwise creamy white d/c.  No pooling. Cervix closed and long. Ext WNL  FHR reactive Occasional mild irritability, isolated contraction.  ED Course  IUP at 33 2/7 weeks 3rd trimester spotting, irritability  Plan: Amnisure, GBS, GC/chlamydia, wet prep--deferred FFN due to small amount spotting. UA Check [place] for growth, fluid, cervical length. If all WNL, will d/c home on pelvic rest, with plan for evaluation for FFN need at Monday appointment.  Nigel Bridgeman, CNM, [place] 06/28/11 11:45a  Addendum: Returned from [place]:  Vtx, AFI 16.70, Cx 4.62, EFW 4+4 (37%ile) Amnisure negative. Wet prep negative UA negative.  Plan: D/C home on pelvic rest. S/S PTL reviewed. Keep scheduled appointment on Monday, with message left for transfer to provider  seeing the patient that day regarding evaluation in MAU (FFN if needed).  Nigel Bridgeman, CNM, [place] 06/28/11  1pm

## 2011-06-28 NOTE — Discharge Instructions (Signed)

## 2011-06-30 ENCOUNTER — Encounter (INDEPENDENT_AMBULATORY_CARE_PROVIDER_SITE_OTHER): Payer: BC Managed Care – PPO

## 2011-06-30 ENCOUNTER — Encounter (INDEPENDENT_AMBULATORY_CARE_PROVIDER_SITE_OTHER): Payer: BC Managed Care – PPO | Admitting: Obstetrics and Gynecology

## 2011-06-30 DIAGNOSIS — Z331 Pregnant state, incidental: Secondary | ICD-10-CM

## 2011-06-30 LAB — GC/CHLAMYDIA PROBE AMP, GENITAL
Chlamydia, DNA Probe: NEGATIVE
GC Probe Amp, Genital: NEGATIVE

## 2011-07-01 LAB — CULTURE, BETA STREP (GROUP B ONLY)

## 2011-07-14 ENCOUNTER — Encounter: Payer: Self-pay | Admitting: Registered Nurse

## 2011-07-14 ENCOUNTER — Ambulatory Visit (INDEPENDENT_AMBULATORY_CARE_PROVIDER_SITE_OTHER): Payer: BC Managed Care – PPO | Admitting: Registered Nurse

## 2011-07-14 VITALS — BP 98/60 | Wt 158.0 lb

## 2011-07-14 DIAGNOSIS — O368190 Decreased fetal movements, unspecified trimester, not applicable or unspecified: Secondary | ICD-10-CM

## 2011-07-14 DIAGNOSIS — Z331 Pregnant state, incidental: Secondary | ICD-10-CM

## 2011-07-14 NOTE — Progress Notes (Addendum)
Expressed concern b/c she was experiencing ctx on 07/11/11 at times 4x/hr, also reports decreased fetal movement 4/12. Reinforced importance of calling to notify office with any concerns. Rev'd signs and sx of PTL. FFN today. NST today. Cx L/C soft.

## 2011-07-14 NOTE — Progress Notes (Signed)
Pt c/o decreased fm over the weekend on Friday  And on Saturday no movent for about three hours, but movement did pick up after that time period.

## 2011-07-14 NOTE — Progress Notes (Signed)
Addended by: Janeece Agee on: 07/14/2011 01:06 PM   Modules accepted: Orders

## 2011-07-21 ENCOUNTER — Ambulatory Visit (INDEPENDENT_AMBULATORY_CARE_PROVIDER_SITE_OTHER): Payer: BC Managed Care – PPO | Admitting: Obstetrics and Gynecology

## 2011-07-21 ENCOUNTER — Encounter: Payer: Self-pay | Admitting: Obstetrics and Gynecology

## 2011-07-21 VITALS — BP 102/56 | Wt 158.0 lb

## 2011-07-21 DIAGNOSIS — R809 Proteinuria, unspecified: Secondary | ICD-10-CM

## 2011-07-21 DIAGNOSIS — Z98891 History of uterine scar from previous surgery: Secondary | ICD-10-CM

## 2011-07-21 DIAGNOSIS — Z331 Pregnant state, incidental: Secondary | ICD-10-CM

## 2011-07-21 DIAGNOSIS — N209 Urinary calculus, unspecified: Secondary | ICD-10-CM | POA: Insufficient documentation

## 2011-07-21 DIAGNOSIS — Z9889 Other specified postprocedural states: Secondary | ICD-10-CM

## 2011-07-21 DIAGNOSIS — N809 Endometriosis, unspecified: Secondary | ICD-10-CM | POA: Insufficient documentation

## 2011-07-21 DIAGNOSIS — Z8742 Personal history of other diseases of the female genital tract: Secondary | ICD-10-CM | POA: Insufficient documentation

## 2011-07-21 DIAGNOSIS — E063 Autoimmune thyroiditis: Secondary | ICD-10-CM

## 2011-07-21 NOTE — Progress Notes (Signed)
Last seen by nephrologist 06/18/11 all normal. Next visit PP Less ctx. No LOF. No bleeding. Birth plan reviewed

## 2011-07-21 NOTE — Progress Notes (Signed)
Pt. Stated no issues today.  

## 2011-07-28 ENCOUNTER — Encounter (HOSPITAL_COMMUNITY): Payer: Self-pay | Admitting: Pharmacist

## 2011-07-29 ENCOUNTER — Ambulatory Visit (INDEPENDENT_AMBULATORY_CARE_PROVIDER_SITE_OTHER): Payer: BC Managed Care – PPO | Admitting: Obstetrics and Gynecology

## 2011-07-29 ENCOUNTER — Ambulatory Visit: Payer: BC Managed Care – PPO | Admitting: Obstetrics and Gynecology

## 2011-07-29 ENCOUNTER — Encounter: Payer: Self-pay | Admitting: Obstetrics and Gynecology

## 2011-07-29 ENCOUNTER — Other Ambulatory Visit: Payer: Self-pay | Admitting: Obstetrics and Gynecology

## 2011-07-29 ENCOUNTER — Telehealth: Payer: Self-pay | Admitting: Obstetrics and Gynecology

## 2011-07-29 VITALS — BP 110/70 | Wt 159.0 lb

## 2011-07-29 DIAGNOSIS — O368190 Decreased fetal movements, unspecified trimester, not applicable or unspecified: Secondary | ICD-10-CM

## 2011-07-29 DIAGNOSIS — Z331 Pregnant state, incidental: Secondary | ICD-10-CM

## 2011-07-29 DIAGNOSIS — Z98891 History of uterine scar from previous surgery: Secondary | ICD-10-CM

## 2011-07-29 DIAGNOSIS — Z3480 Encounter for supervision of other normal pregnancy, unspecified trimester: Secondary | ICD-10-CM

## 2011-07-29 DIAGNOSIS — Z9889 Other specified postprocedural states: Secondary | ICD-10-CM

## 2011-07-29 NOTE — Telephone Encounter (Signed)
Routed to keshia °

## 2011-07-29 NOTE — Progress Notes (Signed)
Came in for W/I visit for c/o decreased fetal movement.  Pt noted excellent fetal movement as soon as she was placed on the fetal monitor. NST:  Reactive GBS DONE. Augmentin sensitivity is GI upset, not allergic reaction, so no sensitivities needed.  Declined cervix check.

## 2011-07-29 NOTE — Telephone Encounter (Signed)
Routed to [employer]

## 2011-07-29 NOTE — Telephone Encounter (Signed)
PT CALLED, IS 36 WKS AND HAS HAD DECREASED FM TODAY, PT SAYS HAS EATEN AND DRANK SOMETHING SWEET AND ALSO WENT TO LYE ON LEFT SIDE, HAS ONLY FELT 3 TO 4 MOVEMENTS ON SEPARATE OCCASIONS TODAY, PT WORKED IN TODAY FOR NST AND EVAL WITH [facility] TODAY.

## 2011-07-30 ENCOUNTER — Encounter: Payer: BC Managed Care – PPO | Admitting: Obstetrics and Gynecology

## 2011-07-30 ENCOUNTER — Ambulatory Visit (INDEPENDENT_AMBULATORY_CARE_PROVIDER_SITE_OTHER): Payer: BC Managed Care – PPO | Admitting: Obstetrics and Gynecology

## 2011-07-30 ENCOUNTER — Telehealth: Payer: Self-pay | Admitting: Obstetrics and Gynecology

## 2011-07-30 ENCOUNTER — Ambulatory Visit (INDEPENDENT_AMBULATORY_CARE_PROVIDER_SITE_OTHER): Payer: BC Managed Care – PPO

## 2011-07-30 ENCOUNTER — Encounter: Payer: Self-pay | Admitting: Obstetrics and Gynecology

## 2011-07-30 VITALS — BP 110/62 | Wt 161.0 lb

## 2011-07-30 DIAGNOSIS — O368190 Decreased fetal movements, unspecified trimester, not applicable or unspecified: Secondary | ICD-10-CM

## 2011-07-30 DIAGNOSIS — Z3480 Encounter for supervision of other normal pregnancy, unspecified trimester: Secondary | ICD-10-CM

## 2011-07-30 NOTE — Progress Notes (Signed)
C/o increased FM today and decreased yest Will do NST and BPP today GBS neg NST reactive and BPP 8/8 with AFI 18 FKCs and Labor Precautions RTO 1wk

## 2011-07-30 NOTE — Telephone Encounter (Signed)
Tc to pt per telephone call. Pt c/o very active fetal movement since 4:30am today non stop. Pt states,"baby is usually not this active and wants to make sure the baby is not in distress". Denies eating foods high in carbs or sugar recently/high sugar drinks. Appt sched today@1 :30p with ar for fht's. Pt agrees.

## 2011-08-06 ENCOUNTER — Encounter: Payer: Self-pay | Admitting: Obstetrics and Gynecology

## 2011-08-06 ENCOUNTER — Ambulatory Visit (INDEPENDENT_AMBULATORY_CARE_PROVIDER_SITE_OTHER): Payer: BC Managed Care – PPO | Admitting: Obstetrics and Gynecology

## 2011-08-06 VITALS — BP 118/62 | Wt 161.0 lb

## 2011-08-06 DIAGNOSIS — Z331 Pregnant state, incidental: Secondary | ICD-10-CM

## 2011-08-06 NOTE — Progress Notes (Signed)
Left Sciatica when walks.BH.

## 2011-08-06 NOTE — Progress Notes (Signed)
Pt is scheduled for c-section on 08/14/11

## 2011-08-11 ENCOUNTER — Encounter (HOSPITAL_COMMUNITY): Payer: Self-pay

## 2011-08-11 ENCOUNTER — Encounter (HOSPITAL_COMMUNITY)
Admission: RE | Admit: 2011-08-11 | Discharge: 2011-08-11 | Disposition: A | Discharge status: Home / Self Care | Payer: BC Managed Care – PPO | Source: Ambulatory Visit | Attending: Obstetrics and Gynecology | Admitting: Obstetrics and Gynecology

## 2011-08-11 VITALS — BP 118/64 | Ht 69.0 in | Wt 160.0 lb

## 2011-08-11 DIAGNOSIS — Z98891 History of uterine scar from previous surgery: Secondary | ICD-10-CM

## 2011-08-11 DIAGNOSIS — O09299 Supervision of pregnancy with other poor reproductive or obstetric history, unspecified trimester: Secondary | ICD-10-CM

## 2011-08-11 DIAGNOSIS — E063 Autoimmune thyroiditis: Secondary | ICD-10-CM

## 2011-08-11 DIAGNOSIS — K900 Celiac disease: Secondary | ICD-10-CM

## 2011-08-11 DIAGNOSIS — R809 Proteinuria, unspecified: Secondary | ICD-10-CM

## 2011-08-11 DIAGNOSIS — Z8659 Personal history of other mental and behavioral disorders: Secondary | ICD-10-CM

## 2011-08-11 HISTORY — DX: Major depressive disorder, single episode, unspecified: F32.9

## 2011-08-11 HISTORY — DX: Thyrotoxicosis, unspecified without thyrotoxic crisis or storm: E05.90

## 2011-08-11 HISTORY — DX: Calculus of kidney: N20.0

## 2011-08-11 HISTORY — DX: Depression, unspecified: F32.A

## 2011-08-11 LAB — CBC
MCH: 29.9 pg (ref 26.0–34.0)
MCV: 90.5 fL (ref 78.0–100.0)
Platelets: 182 10*3/uL (ref 150–400)
RDW: 13.6 % (ref 11.5–15.5)

## 2011-08-11 NOTE — Patient Instructions (Addendum)
YOUR PROCEDURE IS SCHEDULED ON:08/14/11  ENTER THROUGH THE MAIN ENTRANCE OF [facility] AT:0800 am  USE DESK PHONE AND DIAL [id number] TO INFORM us OF YOUR ARRIVAL  CALL [phone] IF YOU HAVE ANY QUESTIONS OR PROBLEMS PRIOR TO YOUR ARRIVAL.  REMEMBER: DO NOT EAT OR DRINK AFTER MIDNIGHT :Wed   YOU MAY BRUSH YOUR TEETH THE MORNING OF SURGERY   TAKE THESE MEDICINES THE DAY OF SURGERY WITH SIP OF WATER:none   DO NOT WEAR JEWELRY, EYE MAKEUP, LIPSTICK OR DARK FINGERNAIL POLISH DO NOT WEAR LOTIONS  DO NOT SHAVE FOR 48 HOURS PRIOR TO SURGERY  YOU WILL NOT BE ALLOWED TO DRIVE YOURSELF HOME.  NAME OF DRIVER:David Dareen Piano

## 2011-08-13 ENCOUNTER — Other Ambulatory Visit: Payer: Self-pay | Admitting: Obstetrics and Gynecology

## 2011-08-13 DIAGNOSIS — O34219 Maternal care for unspecified type scar from previous cesarean delivery: Secondary | ICD-10-CM

## 2011-08-13 MED ORDER — CEFAZOLIN SODIUM-DEXTROSE 2-3 GM-% IV SOLR
2.0000 g | INTRAVENOUS | Status: DC
  Filled 2011-08-13: qty 50

## 2011-08-14 ENCOUNTER — Encounter (HOSPITAL_COMMUNITY): Payer: Self-pay

## 2011-08-14 ENCOUNTER — Encounter (HOSPITAL_COMMUNITY): Payer: Self-pay | Admitting: Anesthesiology

## 2011-08-14 ENCOUNTER — Encounter (HOSPITAL_COMMUNITY)
Admission: RE | Disposition: A | Discharge status: Home / Self Care | Payer: Self-pay | Source: Ambulatory Visit | Attending: Obstetrics and Gynecology

## 2011-08-14 ENCOUNTER — Inpatient Hospital Stay (HOSPITAL_COMMUNITY): Payer: BC Managed Care – PPO | Admitting: Anesthesiology

## 2011-08-14 ENCOUNTER — Inpatient Hospital Stay (HOSPITAL_COMMUNITY)
Admission: RE | Admit: 2011-08-14 | Discharge: 2011-08-17 | DRG: 370 | Disposition: A | Discharge status: Home / Self Care | Payer: BC Managed Care – PPO | Source: Ambulatory Visit | Attending: Obstetrics and Gynecology | Admitting: Obstetrics and Gynecology

## 2011-08-14 DIAGNOSIS — Z01818 Encounter for other preprocedural examination: Secondary | ICD-10-CM

## 2011-08-14 DIAGNOSIS — K900 Celiac disease: Secondary | ICD-10-CM

## 2011-08-14 DIAGNOSIS — O34219 Maternal care for unspecified type scar from previous cesarean delivery: Principal | ICD-10-CM | POA: Diagnosis present

## 2011-08-14 DIAGNOSIS — Z01812 Encounter for preprocedural laboratory examination: Secondary | ICD-10-CM

## 2011-08-14 DIAGNOSIS — Z8659 Personal history of other mental and behavioral disorders: Secondary | ICD-10-CM

## 2011-08-14 DIAGNOSIS — D649 Anemia, unspecified: Secondary | ICD-10-CM | POA: Diagnosis not present

## 2011-08-14 DIAGNOSIS — Z98891 History of uterine scar from previous surgery: Secondary | ICD-10-CM

## 2011-08-14 DIAGNOSIS — O09299 Supervision of pregnancy with other poor reproductive or obstetric history, unspecified trimester: Secondary | ICD-10-CM

## 2011-08-14 DIAGNOSIS — R809 Proteinuria, unspecified: Secondary | ICD-10-CM

## 2011-08-14 DIAGNOSIS — O9903 Anemia complicating the puerperium: Secondary | ICD-10-CM | POA: Diagnosis not present

## 2011-08-14 DIAGNOSIS — E063 Autoimmune thyroiditis: Secondary | ICD-10-CM

## 2011-08-14 SURGERY — Surgical Case
Anesthesia: Spinal

## 2011-08-14 MED ORDER — SCOPOLAMINE 1 MG/3DAYS TD PT72
MEDICATED_PATCH | TRANSDERMAL | Status: AC
  Administered 2011-08-14: 1.5 mg via TRANSDERMAL
  Filled 2011-08-14: qty 1

## 2011-08-14 MED ORDER — PHENYLEPHRINE 40 MCG/ML (10ML) SYRINGE FOR IV PUSH (FOR BLOOD PRESSURE SUPPORT)
PREFILLED_SYRINGE | INTRAVENOUS | Status: AC
  Filled 2011-08-14: qty 5

## 2011-08-14 MED ORDER — BUPIVACAINE HCL (PF) 0.25 % IJ SOLN
INTRAMUSCULAR | Status: AC
  Filled 2011-08-14: qty 30

## 2011-08-14 MED ORDER — BUPIVACAINE IN DEXTROSE 0.75-8.25 % IT SOLN
INTRATHECAL | Status: DC | PRN
  Administered 2011-08-14: 1.5 mL via INTRATHECAL

## 2011-08-14 MED ORDER — ONDANSETRON HCL 4 MG PO TABS
4.0000 mg | ORAL_TABLET | ORAL | Status: DC | PRN
  Administered 2011-08-15 – 2011-08-17 (×2): 4 mg via ORAL
  Filled 2011-08-14 (×2): qty 1

## 2011-08-14 MED ORDER — SODIUM CHLORIDE 0.9 % IJ SOLN: 9.0000 mL | INTRAMUSCULAR | Status: DC | PRN

## 2011-08-14 MED ORDER — LACTATED RINGERS IV SOLN
INTRAVENOUS | Status: DC | PRN
  Administered 2011-08-14 (×3): via INTRAVENOUS

## 2011-08-14 MED ORDER — NALBUPHINE HCL 10 MG/ML IJ SOLN
5.0000 mg | INTRAMUSCULAR | Status: DC | PRN
  Administered 2011-08-14: 10 mg via SUBCUTANEOUS
  Filled 2011-08-14: qty 1

## 2011-08-14 MED ORDER — NALOXONE HCL 0.4 MG/ML IJ SOLN: 0.4000 mg | INTRAMUSCULAR | Status: DC | PRN

## 2011-08-14 MED ORDER — HYDROMORPHONE HCL PF 1 MG/ML IJ SOLN
0.2500 mg | INTRAMUSCULAR | Status: DC | PRN
  Administered 2011-08-14 (×4): 0.5 mg via INTRAVENOUS

## 2011-08-14 MED ORDER — OXYTOCIN 10 UNIT/ML IJ SOLN
INTRAMUSCULAR | Status: DC | PRN
  Administered 2011-08-14: 20 [IU] via INTRAMUSCULAR

## 2011-08-14 MED ORDER — KETOROLAC TROMETHAMINE 30 MG/ML IJ SOLN
INTRAMUSCULAR | Status: AC
  Filled 2011-08-14: qty 1

## 2011-08-14 MED ORDER — DIPHENHYDRAMINE HCL 50 MG/ML IJ SOLN
INTRAMUSCULAR | Status: DC | PRN
  Administered 2011-08-14: 25 mg via INTRAVENOUS

## 2011-08-14 MED ORDER — LACTATED RINGERS IV SOLN
INTRAVENOUS | Status: DC
  Administered 2011-08-14: 09:00:00 via INTRAVENOUS

## 2011-08-14 MED ORDER — WITCH HAZEL-GLYCERIN EX PADS: 1.0000 "application " | MEDICATED_PAD | CUTANEOUS | Status: DC | PRN

## 2011-08-14 MED ORDER — DIBUCAINE 1 % RE OINT: 1.0000 "application " | TOPICAL_OINTMENT | RECTAL | Status: DC | PRN

## 2011-08-14 MED ORDER — NALBUPHINE HCL 10 MG/ML IJ SOLN
5.0000 mg | INTRAMUSCULAR | Status: DC | PRN
  Filled 2011-08-14: qty 1

## 2011-08-14 MED ORDER — KETOROLAC TROMETHAMINE 30 MG/ML IJ SOLN: 30.0000 mg | Freq: Four times a day (QID) | INTRAMUSCULAR | Status: AC | PRN

## 2011-08-14 MED ORDER — HYDROMORPHONE 0.3 MG/ML IV SOLN
INTRAVENOUS | Status: DC
  Administered 2011-08-14: 13:00:00 via INTRAVENOUS
  Filled 2011-08-14: qty 25

## 2011-08-14 MED ORDER — LACTATED RINGERS IV SOLN
INTRAVENOUS | Status: DC
  Administered 2011-08-14: 20:00:00 via INTRAVENOUS

## 2011-08-14 MED ORDER — FENTANYL CITRATE 0.05 MG/ML IJ SOLN
INTRAMUSCULAR | Status: AC
  Filled 2011-08-14: qty 2

## 2011-08-14 MED ORDER — SENNOSIDES-DOCUSATE SODIUM 8.6-50 MG PO TABS
2.0000 | ORAL_TABLET | Freq: Every day | ORAL | Status: DC
Start: 2011-08-14 — End: 2011-08-17
  Administered 2011-08-14 – 2011-08-16 (×3): 2 via ORAL

## 2011-08-14 MED ORDER — ZOLPIDEM TARTRATE 5 MG PO TABS: 5.0000 mg | ORAL_TABLET | Freq: Every evening | ORAL | Status: DC | PRN

## 2011-08-14 MED ORDER — BUPIVACAINE HCL (PF) 0.25 % IJ SOLN
INTRAMUSCULAR | Status: DC | PRN
  Administered 2011-08-14: 20 mL

## 2011-08-14 MED ORDER — SIMETHICONE 80 MG PO CHEW
80.0000 mg | CHEWABLE_TABLET | Freq: Three times a day (TID) | ORAL | Status: DC
Start: 2011-08-14 — End: 2011-08-17
  Administered 2011-08-14 – 2011-08-17 (×11): 80 mg via ORAL

## 2011-08-14 MED ORDER — KETOROLAC TROMETHAMINE 30 MG/ML IJ SOLN
15.0000 mg | Freq: Once | INTRAMUSCULAR | Status: AC | PRN
  Administered 2011-08-14: 30 mg via INTRAVENOUS

## 2011-08-14 MED ORDER — PHENYLEPHRINE HCL 10 MG/ML IJ SOLN
INTRAMUSCULAR | Status: DC | PRN
  Administered 2011-08-14: 40 ug via INTRAVENOUS
  Administered 2011-08-14 (×2): 80 ug via INTRAVENOUS

## 2011-08-14 MED ORDER — ONDANSETRON HCL 4 MG/2ML IJ SOLN: 4.0000 mg | INTRAMUSCULAR | Status: DC | PRN

## 2011-08-14 MED ORDER — ONDANSETRON HCL 4 MG/2ML IJ SOLN
INTRAMUSCULAR | Status: DC | PRN
  Administered 2011-08-14 (×2): 2 mg via INTRAVENOUS

## 2011-08-14 MED ORDER — LANOLIN HYDROUS EX OINT: 1.0000 "application " | TOPICAL_OINTMENT | CUTANEOUS | Status: DC | PRN

## 2011-08-14 MED ORDER — HYDROMORPHONE HCL PF 1 MG/ML IJ SOLN
INTRAMUSCULAR | Status: AC
  Administered 2011-08-14: 0.5 mg via INTRAVENOUS
  Filled 2011-08-14: qty 1

## 2011-08-14 MED ORDER — EPHEDRINE 5 MG/ML INJ
INTRAVENOUS | Status: AC
  Filled 2011-08-14: qty 10

## 2011-08-14 MED ORDER — IBUPROFEN 600 MG PO TABS: 600.0000 mg | ORAL_TABLET | Freq: Four times a day (QID) | ORAL | Status: DC | PRN

## 2011-08-14 MED ORDER — SIMETHICONE 80 MG PO CHEW: 80.0000 mg | CHEWABLE_TABLET | ORAL | Status: DC | PRN

## 2011-08-14 MED ORDER — TETANUS-DIPHTH-ACELL PERTUSSIS 5-2.5-18.5 LF-MCG/0.5 IM SUSP: 0.5000 mL | Freq: Once | INTRAMUSCULAR | Status: DC

## 2011-08-14 MED ORDER — DIPHENHYDRAMINE HCL 50 MG/ML IJ SOLN
INTRAMUSCULAR | Status: AC
  Filled 2011-08-14: qty 1

## 2011-08-14 MED ORDER — ONDANSETRON HCL 4 MG/2ML IJ SOLN: 4.0000 mg | Freq: Four times a day (QID) | INTRAMUSCULAR | Status: DC | PRN

## 2011-08-14 MED ORDER — DIPHENHYDRAMINE HCL 12.5 MG/5ML PO ELIX
12.5000 mg | ORAL_SOLUTION | Freq: Four times a day (QID) | ORAL | Status: DC | PRN
  Filled 2011-08-14: qty 5

## 2011-08-14 MED ORDER — IBUPROFEN 600 MG PO TABS
600.0000 mg | ORAL_TABLET | Freq: Four times a day (QID) | ORAL | Status: DC
Start: 2011-08-14 — End: 2011-08-17
  Administered 2011-08-14 – 2011-08-17 (×12): 600 mg via ORAL
  Filled 2011-08-14 (×12): qty 1

## 2011-08-14 MED ORDER — SCOPOLAMINE 1 MG/3DAYS TD PT72
1.0000 | MEDICATED_PATCH | Freq: Once | TRANSDERMAL | Status: DC
  Administered 2011-08-14: 1.5 mg via TRANSDERMAL

## 2011-08-14 MED ORDER — OXYCODONE-ACETAMINOPHEN 5-325 MG PO TABS
1.0000 | ORAL_TABLET | ORAL | Status: DC | PRN
  Administered 2011-08-14 – 2011-08-15 (×5): 2 via ORAL
  Filled 2011-08-14 (×5): qty 2

## 2011-08-14 MED ORDER — HYDROMORPHONE HCL PF 1 MG/ML IJ SOLN
1.0000 mg | Freq: Once | INTRAMUSCULAR | Status: AC
  Administered 2011-08-14: 1 mg via INTRAVENOUS
  Filled 2011-08-14: qty 1

## 2011-08-14 MED ORDER — DIPHENHYDRAMINE HCL 50 MG/ML IJ SOLN: 12.5000 mg | Freq: Four times a day (QID) | INTRAMUSCULAR | Status: DC | PRN

## 2011-08-14 MED ORDER — FENTANYL CITRATE 0.05 MG/ML IJ SOLN
INTRAMUSCULAR | Status: DC | PRN
  Administered 2011-08-14: 15 ug via INTRATHECAL

## 2011-08-14 MED ORDER — CEFAZOLIN SODIUM 1-5 GM-% IV SOLN
INTRAVENOUS | Status: DC | PRN
  Administered 2011-08-14: 2 g via INTRAVENOUS

## 2011-08-14 MED ORDER — MENTHOL 3 MG MT LOZG: 1.0000 | LOZENGE | OROMUCOSAL | Status: DC | PRN

## 2011-08-14 MED ORDER — OXYTOCIN 20 UNITS IN LACTATED RINGERS INFUSION - SIMPLE
125.0000 mL/h | INTRAVENOUS | Status: AC
  Administered 2011-08-14: 125 mL/h via INTRAVENOUS
  Filled 2011-08-14: qty 1000

## 2011-08-14 MED ORDER — FENTANYL CITRATE 0.05 MG/ML IJ SOLN
INTRAMUSCULAR | Status: DC | PRN
  Administered 2011-08-14 (×3): 25 ug via INTRAVENOUS
  Administered 2011-08-14: 10 ug via INTRAVENOUS

## 2011-08-14 MED ORDER — ONDANSETRON HCL 4 MG/2ML IJ SOLN
INTRAMUSCULAR | Status: AC
  Filled 2011-08-14: qty 2

## 2011-08-14 MED ORDER — DIPHENHYDRAMINE HCL 25 MG PO CAPS
25.0000 mg | ORAL_CAPSULE | Freq: Four times a day (QID) | ORAL | Status: DC | PRN
  Administered 2011-08-14 – 2011-08-15 (×4): 25 mg via ORAL
  Filled 2011-08-14 (×4): qty 1

## 2011-08-14 MED ORDER — EPHEDRINE SULFATE 50 MG/ML IJ SOLN
INTRAMUSCULAR | Status: DC | PRN
  Administered 2011-08-14: 5 mg via INTRAVENOUS
  Administered 2011-08-14: 10 mg via INTRAVENOUS
  Administered 2011-08-14 (×2): 5 mg via INTRAVENOUS
  Administered 2011-08-14: 10 mg via INTRAVENOUS

## 2011-08-14 SURGICAL SUPPLY — 43 items
BENZOIN TINCTURE PRP APPL 2/3 (GAUZE/BANDAGES/DRESSINGS) IMPLANT
BLADE EXTENDED COATED 6.5IN (ELECTRODE) IMPLANT
BLADE HEX COATED 2.75 (ELECTRODE) IMPLANT
BOOTIES KNEE HIGH SLOAN (MISCELLANEOUS) ×4 IMPLANT
CHLORAPREP W/TINT 26ML (MISCELLANEOUS) ×2 IMPLANT
CLOTH BEACON ORANGE TIMEOUT ST (SAFETY) ×2 IMPLANT
CONTAINER PREFILL 10% NBF 15ML (MISCELLANEOUS) ×4 IMPLANT
DERMABOND ADVANCED (GAUZE/BANDAGES/DRESSINGS)
DERMABOND ADVANCED .7 DNX12 (GAUZE/BANDAGES/DRESSINGS) IMPLANT
DRAIN JACKSON PRT FLT 7MM (DRAIN) IMPLANT
DRESSING TELFA 8X3 (GAUZE/BANDAGES/DRESSINGS) ×2 IMPLANT
ELECT REM PT RETURN 9FT ADLT (ELECTROSURGICAL) ×2
ELECTRODE REM PT RTRN 9FT ADLT (ELECTROSURGICAL) ×1 IMPLANT
EVACUATOR SILICONE 100CC (DRAIN) IMPLANT
EXTRACTOR VACUUM KIWI (MISCELLANEOUS) IMPLANT
EXTRACTOR VACUUM M CUP 4 TUBE (SUCTIONS) IMPLANT
GAUZE SPONGE 4X4 12PLY STRL LF (GAUZE/BANDAGES/DRESSINGS) ×4 IMPLANT
GLOVE SURG SS PI 6.5 STRL IVOR (GLOVE) ×4 IMPLANT
GOWN PREVENTION PLUS LG XLONG (DISPOSABLE) ×6 IMPLANT
KIT ABG SYR 3ML LUER SLIP (SYRINGE) IMPLANT
NEEDLE HYPO 25X5/8 SAFETYGLIDE (NEEDLE) ×2 IMPLANT
NEEDLE SPNL 22GX3.5 QUINCKE BK (NEEDLE) ×2 IMPLANT
NS IRRIG 1000ML POUR BTL (IV SOLUTION) ×2 IMPLANT
PACK C SECTION WH (CUSTOM PROCEDURE TRAY) ×2 IMPLANT
PAD ABD 7.5X8 STRL (GAUZE/BANDAGES/DRESSINGS) ×2 IMPLANT
SLEEVE SCD COMPRESS KNEE MED (MISCELLANEOUS) ×2 IMPLANT
STRIP CLOSURE SKIN 1/4X4 (GAUZE/BANDAGES/DRESSINGS) IMPLANT
SUT CHROMIC 2 0 SH (SUTURE) ×2 IMPLANT
SUT MNCRL AB 3-0 PS2 27 (SUTURE) ×2 IMPLANT
SUT SILK 0 FSL (SUTURE) IMPLANT
SUT VIC AB 0 CT1 27 (SUTURE) ×2
SUT VIC AB 0 CT1 27XBRD ANBCTR (SUTURE) ×2 IMPLANT
SUT VIC AB 0 CT1 36 (SUTURE) IMPLANT
SUT VIC AB 0 CTXB 36 (SUTURE) IMPLANT
SUT VIC AB 2-0 CT1 27 (SUTURE) ×2
SUT VIC AB 2-0 CT1 TAPERPNT 27 (SUTURE) ×2 IMPLANT
SUT VIC AB 2-0 SH 27 (SUTURE)
SUT VIC AB 2-0 SH 27XBRD (SUTURE) IMPLANT
SYR CONTROL 10ML LL (SYRINGE) ×2 IMPLANT
TAPE CLOTH SURG 4X10 WHT LF (GAUZE/BANDAGES/DRESSINGS) ×2 IMPLANT
TOWEL OR 17X24 6PK STRL BLUE (TOWEL DISPOSABLE) ×4 IMPLANT
TRAY FOLEY CATH 14FR (SET/KITS/TRAYS/PACK) ×2 IMPLANT
WATER STERILE IRR 1000ML POUR (IV SOLUTION) ×2 IMPLANT

## 2011-08-14 NOTE — Addendum Note (Signed)
Addendum  created 08/14/11 1703 by Graciela Husbands, CRNA   Modules edited:Notes Section

## 2011-08-14 NOTE — Anesthesia Postprocedure Evaluation (Signed)
  Anesthesia Post-op Note  Patient: Alexandria Schneider  Procedure(s) Performed: Procedure(s) (LRB): CESAREAN SECTION (N/A)  Patient Location: 123   Anesthesia Type: Spinal  Level of Consciousness: awake, alert  and oriented  Airway and Oxygen Therapy: Patient Spontanous Breathing  Post-op Pain: moderate  Post-op Assessment: Post-op Vital signs reviewed, Patient's Cardiovascular Status Stable, Pain level controlled, No headache, No backache, No residual numbness and No residual motor weakness  Post-op Vital Signs: Reviewed and stable  Complications: No apparent anesthesia complications

## 2011-08-14 NOTE — Anesthesia Preprocedure Evaluation (Signed)
Anesthesia Evaluation  Patient identified by MRN, date of birth, ID band Patient awake    Reviewed: Allergy & Precautions, H&P , NPO status , Patient's Chart, lab work & pertinent test results, reviewed documented beta blocker date and time   History of Anesthesia Complications (+) PROLONGED EMERGENCE  Airway Mallampati: II TM Distance: >3 FB Neck ROM: full    Dental  (+) Teeth Intact   Pulmonary neg pulmonary ROS,  breath sounds clear to auscultation        Cardiovascular negative cardio ROS  Rhythm:regular Rate:Normal     Neuro/Psych  Headaches (migraines), negative psych ROS   GI/Hepatic Neg liver ROS, Celiac disease   Endo/Other  negative endocrine ROS  Renal/GU negative Renal ROS  negative genitourinary   Musculoskeletal   Abdominal   Peds  Hematology negative hematology ROS (+)   Anesthesia Other Findings   Reproductive/Obstetrics (+) Pregnancy (h/o c/sx1)                           Anesthesia Physical Anesthesia Plan  ASA: II  Anesthesia Plan: Spinal   Post-op Pain Management:    Induction:   Airway Management Planned:   Additional Equipment:   Intra-op Plan:   Post-operative Plan:   Informed Consent: I have reviewed the patients History and Physical, chart, labs and discussed the procedure including the risks, benefits and alternatives for the proposed anesthesia with the patient or authorized representative who has indicated his/her understanding and acceptance.     Plan Discussed with: Surgeon and CRNA  Anesthesia Plan Comments: (Patient does not want duramorph in spinal due to severe itching after last c/s.  Is allergic to fish/seafood (stomach upset) but does not have a problem with betadine.)        Anesthesia Quick Evaluation

## 2011-08-14 NOTE — Transfer of Care (Signed)
Immediate Anesthesia Transfer of Care Note  Patient: Alexandria Schneider  Procedure(s) Performed: Procedure(s) (LRB): CESAREAN SECTION (N/A)  Patient Location: PACU  Anesthesia Type: Spinal  Level of Consciousness: awake, alert  and oriented  Airway & Oxygen Therapy: Patient Spontanous Breathing  Post-op Assessment: Report given to PACU RN  Post vital signs: Reviewed and stable  Complications: No apparent anesthesia complications

## 2011-08-14 NOTE — Op Note (Signed)
Cesarean Section Procedure Note  Indications: prior cesarean section desire for repeat cesarean section  Pre-operative Diagnosis: 39 week 0 day pregnancy.     Prior cesarean section desire for repeat cesarean section  Post-operative Diagnosis: same  Surgeon: Hal Morales  First Assistant:Shelly Lillard certified nurse midwife  Surgeon: Hal Morales MD  Anesthesia: Spinal anesthesia  ASA Class: 2  Procedure Details  The patient was seen in the Holding Room. The risks, benefits, complications, treatment options, and expected outcomes were discussed with the patient.  The patient concurred with the proposed plan, giving informed consent.  The site of surgery properly noted/marked. The patient was taken to Operating Room # 9, identified as Alexandria Schneider and the procedure verified as C-Section Delivery. A Time Out was held and the above information confirmed.  After induction of anesthesia, the patient was  prepped withChloraPrep in the usual sterile manner.A foley catheter was placed under sterile conditions.  The patient was then draped in the usual fashion.   Suprapubicsubcutaneous injection of 0.25% Bupivacaine   A Pfannenstiel incision was made and carried down through the subcutaneous tissue to the fascia. Fascial incision was made and extended transversely. The fascia was separated from the underlying rectus tissue superiorly and inferiorly. The peritoneum was identified and entered. Peritoneal incision was extended longitudinally.  The bladder blade was placed.   A low transverse uterine incision was made two cm above the uterovesical fold, and that incision extended transversely bluntly.The infant was  delivered from OT presentation was a female infant with Apgar scores of 9 at one minute and 9 at five minutes. After the umbilical cord was clamped and cut cord blood was obtained for evaluation. The placenta was removed intact and appeared normal. The uterine outline, tubes and  ovaries appeared norma for the gravid state. The uterine incision was closed with running locked sutures of 0 Vicryl. An imbricating layer of sutures was placed. Hemostasis was observed. Lavage was carried out until clear. The peritoneum was closed with a running suture of 2-0 Vicryl.  The rectus muscles were reapproximated with a figure of 8 suture of 2-0 Vicryl.  The fascia was then reapproximated with a running sutures of 0 Vicryl .Renforcing figure of 8 sutures of 0Vicryl were placed on either side of midline.   The skin was reapproximated with}3-0 moncryl.  A sterile dressing was applied.  Instrument, sponge, and needle counts were correct prior to the abdominal closure and at the conclusion of the case.   Findings:  Placenta contained a 3vessel cord    Estimated Blood Loss:  750cc         Drains: none         Total IV Fluids:         Specimens: placenta to birthing suites         Implants: none         Complications: ::"None; patient tolerated the procedure well."         Disposition: PACU - hemodynamically stable.         Condition: stable  Attending Attestation: I performed the procedure.  Joeanna Howdyshell P  08/14/2011 11:37 AM

## 2011-08-14 NOTE — H&P (Addendum)
Alexandria Schneider is a 31 y.o. female presenting for repeat C/S. Denies ctx, VB or LOF. +FM.   HPI: pt began [facility] at [facility] at 9w. [place] at about 5w for c/o abdominal pain, C/W LMP dating. Pt was seen in MAU for vag bleeding at 16w, [place] at that time was WNL and bleeding resolved without complications. Anatomy US was WNL at 19weeks. TSH was 5.121 at 19weeks. Normal 1hr gtt at 27weeks and TSH was 3.778 at that time. Pt has c/o of spotting and cramping at 32weeks, GC/CT/FFN were neg and spotting resolved. Pt has a BPP on 5/1 for c/o decreased FM, it was WNL 8/8. GBS was neg at 36weeks.   Pregnancy significant for:  1. Hx kidney stones - followed by nephrology 2. Multiple food and abx allergy 3. hashimotos dx  - followed by dr Justice Rocher 4. Asthma 5. Hx depression 6. Hx prior c/s for fetal distress 7. Celiac dx 8. Hx endometriosis with lap 9. Anemia 10. Hx smoking 11. Hx 2nd trimester bleeding 12. Hx pre-eclampsia  Maternal Medical History:  Reason for admission: Reason for admission: nausea.  Scheduled repeat C/S  Contractions: Denies   Fetal activity: Perceived fetal activity is normal.   Last perceived fetal movement was within the past hour.    Prenatal complications: Bleeding.   Second trimester bleeding, resolved without complication    OB History    Grav Para Term Preterm Abortions TAB SAB Ect Mult Living   3 1 1  0 1 0 1 0 0 1    1998 12w SAB 2/11 C/S   Past Medical History  Diagnosis Date  . Celiac disease   . Hashimoto disease   . Asthma   . Urolithiasis   . Proteinuria 2010  . Stone, kidney   . Hyperthyroidism     Hashimoto's  . Headache     migraines  . Depression     as a teen  . Complication of anesthesia     slow to wake up   Past Surgical History  Procedure Date  . Laparoscopic endometriosis fulguration   . Tonsillectomy   . Nasal sinus surgery   . Cesarean section    Family History: family history includes Alcohol abuse in her brother and father; COPD in  her father; Depression in her maternal grandmother; Diabetes in her maternal grandmother; Emphysema in her father; Heart disease in her father, paternal grandfather, and paternal grandmother; Hypertension in her father; Kidney disease in her maternal grandmother; and Mental illness in her maternal grandmother.  There is no history of Anesthesia problems, and Hypotension, and Malignant hyperthermia, and Pseudochol deficiency, . Social History:  reports that she has never smoked. She has never used smokeless tobacco. She reports that she does not drink alcohol or use illicit drugs.  Review of Systems  Gastrointestinal: Positive for nausea.  Psychiatric/Behavioral: The patient is nervous/anxious.   All other systems reviewed and are negative.      Last menstrual period 11/14/2010. Maternal Exam:  Abdomen: Patient reports no abdominal tenderness. Surgical scars: low transverse.   Fundal height is aga.   Fetal presentation: vertex  Introitus: not evaluated.   Cervix: not evaluated.   Fetal Exam Fetal Monitor Review: Mode: hand-held doppler probe.       Physical Exam  Nursing note and vitals reviewed. Constitutional: She is oriented to person, place, and time. She appears well-developed and well-nourished. No distress.  HENT:  Head: Normocephalic.  Neck: Normal range of motion.  Cardiovascular: Normal rate, regular rhythm  and normal heart sounds.   Respiratory: Effort normal and breath sounds normal.  GI: Soft.  Musculoskeletal: Normal range of motion. She exhibits no edema.  Neurological: She is alert and oriented to person, place, and time.  Skin: Skin is warm and dry.  Psychiatric: She has a normal mood and affect. Her behavior is normal.    Prenatal labs: ABO, Rh: B/Positive/-- (11/13 0000) Antibody: Negative (11/13 0000) Rubella: Immune (11/13 0000) RPR: NON REACTIVE (05/13 1107)  HBsAg: Negative (11/13 0000)  HIV: Non-reactive (11/13 0000)  GBS: NEGATIVE (04/30 1617)    Pap/GC/CT neg FFN/GC/CT at 32wks need 1hr gtt at 27wks = 102 Hgb=11.0 RPR NR  Assessment/Plan: IUP at [redacted]w[redacted]d GBS neg  Admit to pre-op per Dr. Pennie Rushing attending Repeat C/S   Alexandria Schneider 08/14/2011, 7:55 AM

## 2011-08-14 NOTE — Anesthesia Procedure Notes (Signed)
Spinal  Patient location during procedure: OR Start time: 08/14/2011 9:30 AM Staffing Performed by: anesthesiologist  Preanesthetic Checklist Completed: patient identified, site marked, surgical consent, pre-op evaluation, timeout performed, IV checked, risks and benefits discussed and monitors and equipment checked Spinal Block Patient position: sitting Prep: site prepped and draped and DuraPrep Patient monitoring: heart rate, continuous pulse ox and blood pressure Approach: midline Location: L3-4 Injection technique: single-shot Needle Needle type: Sprotte  Needle gauge: 24 G Needle length: 9 cm Assessment Sensory level: T4 Additional Notes Clear free flow CSF on first attempt.  No paresthesia.  Patient tolerated procedure well.  Jasmine December, MD

## 2011-08-14 NOTE — Anesthesia Postprocedure Evaluation (Signed)
Anesthesia Post Note  Patient: Alexandria Schneider  Procedure(s) Performed: Procedure(s) (LRB): CESAREAN SECTION (N/A)  Anesthesia type: Spinal  Patient location: PACU  Post pain: Pain level controlled  Post assessment: Post-op Vital signs reviewed  Post vital signs: Reviewed  Level of consciousness: awake  Complications: No apparent anesthesia complications

## 2011-08-15 LAB — CBC
HCT: 26.3 % — ABNORMAL LOW (ref 36.0–46.0)
Hemoglobin: 8.5 g/dL — ABNORMAL LOW (ref 12.0–15.0)
MCH: 29.3 pg (ref 26.0–34.0)
RBC: 2.9 MIL/uL — ABNORMAL LOW (ref 3.87–5.11)

## 2011-08-15 MED ORDER — HYDROMORPHONE HCL 2 MG PO TABS
2.0000 mg | ORAL_TABLET | ORAL | Status: DC | PRN
  Administered 2011-08-15 – 2011-08-16 (×9): 2 mg via ORAL
  Filled 2011-08-15 (×8): qty 1

## 2011-08-15 MED ORDER — HYDROMORPHONE HCL 2 MG PO TABS
ORAL_TABLET | ORAL | Status: AC
  Filled 2011-08-15: qty 1

## 2011-08-15 NOTE — Progress Notes (Signed)

## 2011-08-15 NOTE — Progress Notes (Signed)
Subjective: Postpartum Day 1 Cesarean Delivery Patient reports" no nausea, tolerating PO and no problems voiding.  No pain relief with percocet and motrin, pain level 7, with relief with dilaudid, itchy since surgery but it is tolerable and was so severe last C/S with morphine that I could not use that again" BP 101/63  Pulse 62  Temp(Src) 98.2 F (36.8 C) (Oral)  Resp 18  SpO2 97%  LMP 11/14/2010  Breastfeeding? Unknown Hemoglobin & Hematocrit     Component Value Date/Time   HGB 8.5* 08/15/2011 0625   HCT 26.3* 08/15/2011 4098     Physical Exam:  General: alert, cooperative and no distress Lochia: appropriate Uterine Fundus: firm Incision: abd dressing dry DVT Evaluation: Negative Homan's sign. No significant calf/ankle edema. Lungs clear bilaterally AP RRR Bowel sounds active abd.......nt,  S: comfortable, little bleeding, slept     feeding A normal involution     Lactating     PO day 1     Normal involution P discussed pain management with Dr. Stefano Gaul per telephone will discontinue percocet use dilaudid and motrin discussed with pt may need meds q 2 hours to get on top of pain then q 4 hours as needed, encouraged ambulation, continue care Lavera Guise, CNM

## 2011-08-16 ENCOUNTER — Encounter (HOSPITAL_COMMUNITY): Payer: Self-pay | Admitting: Obstetrics and Gynecology

## 2011-08-16 DIAGNOSIS — Z98891 History of uterine scar from previous surgery: Secondary | ICD-10-CM | POA: Diagnosis not present

## 2011-08-16 MED ORDER — HYDROMORPHONE HCL 2 MG PO TABS
4.0000 mg | ORAL_TABLET | ORAL | Status: DC | PRN
  Administered 2011-08-16 (×2): 4 mg via ORAL
  Administered 2011-08-16: 2 mg via ORAL
  Filled 2011-08-16: qty 1
  Filled 2011-08-16 (×2): qty 2

## 2011-08-16 MED ORDER — HYDROMORPHONE HCL 2 MG PO TABS
2.0000 mg | ORAL_TABLET | ORAL | Status: DC | PRN
  Administered 2011-08-16 – 2011-08-17 (×5): 2 mg via ORAL
  Filled 2011-08-16 (×5): qty 1

## 2011-08-16 NOTE — Progress Notes (Addendum)
Subjective: Postpartum Day : Cesarean Delivery 2 Patient reports incisional pain, tolerating PO, + flatus and no problems voiding.  Incision hurts every time I move and it keeps hurting, using dilaudid q 2 hours Objective: Results for orders placed during the hospital encounter of 08/14/11 (from the past 48 hour(s))  CBC     Status: Abnormal   Collection Time   08/15/11  6:25 AM      Component Value Range Comment   WBC 11.6 (*) 4.0 - 10.5 (K/uL)    RBC 2.90 (*) 3.87 - 5.11 (MIL/uL)    Hemoglobin 8.5 (*) 12.0 - 15.0 (g/dL)    HCT 45.4 (*) 09.8 - 46.0 (%)    MCV 90.7  78.0 - 100.0 (fL)    MCH 29.3  26.0 - 34.0 (pg)    MCHC 32.3  30.0 - 36.0 (g/dL)    RDW 11.9  14.7 - 82.9 (%)    Platelets 195  150 - 400 (K/uL)    Vital signs in last 24 hours: BP 92/56  Pulse 64  Temp(Src) 98.2 F (36.8 C) (Oral)  Resp 20  SpO2 98%  LMP 11/14/2010  Breastfeeding? Unknown Physical Exam:  General: alert, cooperative and no distress Lochia: appropriate Uterine Fundus: firm Incision: dressing on dry DVT Evaluation: Negative Homan's sign. No significant calf/ankle edema. Lungs clear bilaterally AP RRR Bowel sounds active abd, nt, typanic on percussion S: comfortable, little bleeding, slept     feeding A normal involution     Lactating     Normal involution P changed dilaudid dosing to 4 mg q 4 hours discussed, encouraged ambulation and remove dressing with shower today. continue care Lavera Guise, CNM   Addendum: [facility] staff called me during the evening of 5/18 reporting patient's request to return to Dilaudid 2 mg po q 2 hours prn. Order changed back to original 2 mg po q 2 hours prn.  Nigel Bridgeman, CNM, [place] 08/17/11 2:20am

## 2011-08-17 ENCOUNTER — Encounter (HOSPITAL_COMMUNITY)
Admission: RE | Admit: 2011-08-17 | Discharge: 2011-08-17 | Disposition: A | Discharge status: Home / Self Care | Payer: BC Managed Care – PPO | Source: Ambulatory Visit | Attending: Obstetrics and Gynecology | Admitting: Obstetrics and Gynecology

## 2011-08-17 DIAGNOSIS — O923 Agalactia: Secondary | ICD-10-CM | POA: Insufficient documentation

## 2011-08-17 MED ORDER — FERROUS SULFATE 325 (65 FE) MG PO TABS: 325.0000 mg | ORAL_TABLET | Freq: Every day | ORAL | Status: DC

## 2011-08-17 MED ORDER — HYDROMORPHONE HCL 2 MG PO TABS: 2.0000 mg | ORAL_TABLET | ORAL | Status: AC | PRN

## 2011-08-17 MED ORDER — IBUPROFEN 600 MG PO TABS: 600.0000 mg | ORAL_TABLET | Freq: Four times a day (QID) | ORAL | Status: AC

## 2011-08-17 NOTE — Discharge Summary (Signed)
Physician Discharge Summary  Patient ID: Alexandria Schneider MRN: [id number] DOB/AGE: Apr 05, 1980 31 y.o.  Admit date: 08/14/2011 Discharge date: 08/17/2011  Admission Diagnoses: term pg for repeat c/s  Discharge Diagnoses:  Principal Problem:  *Status post repeat low transverse cesarean section   Discharged Condition: stable  Hospital Course: term pg scheduled repeat c/s normal involution, anemia  Consults: None  Significant Diagnostic Studies: labs:  Hemoglobin & Hematocrit     Component Value Date/Time   HGB 8.5* 08/15/2011 0625   HCT 26.3* 08/15/2011 0625    Treatments: IV hydration S no pain without moving, using dilaudid q 2 hours, slept, little bleeding breastfeeding well Discharge Exam: Blood pressure 102/67, pulse 73, temperature 98.4 F (36.9 C), temperature source Oral, resp. rate 18, last menstrual period 11/14/2010, SpO2 98.00%, unknown if currently breastfeeding. General appearance: alert, cooperative and no distressCalm, no distress, HEENT WNL,  lungs clear bilaterally, AP RRR, abd soft nt,no masses, not tympanic bowel sounds active, abdomen nontender, ff sm serosa flow incision well approximated noreactive -Homan's sign bilaterally, no edema to lower extremities  Disposition: 01-Home or Self Care  Discharge Orders    Future Appointments: Provider: Department: Dept Phone: Center:   09/17/2011 10:15 AM Hal Morales, MD [facility] [phone] None     Medication List  As of 08/17/2011  9:08 AM   STOP taking these medications         DHA OMEGA 3 PO         TAKE these medications         acetaminophen 500 MG tablet   Commonly known as: TYLENOL   Take 500-1,000 mg by mouth every 6 (six) hours as needed. For pain/headache      ferrous sulfate 325 (65 FE) MG tablet   Take 1 tablet (325 mg total) by mouth daily with breakfast.      HYDROmorphone 2 MG tablet   Commonly known as: DILAUDID   Take 1 tablet (2 mg total) by mouth every 4 (four) hours as  needed.      ibuprofen 600 MG tablet   Commonly known as: ADVIL,MOTRIN   Take 1 tablet (600 mg total) by mouth every 6 (six) hours.      PAPAYA ENZYME PO   Take by mouth 2 (two) times daily as needed.      prenatal vitamin w/FE, FA 27-1 MG Tabs   Take 1 tablet by mouth daily.           Follow-up Information    Follow up with [facility] in 6 weeks.        Discussed use of dilaudid need to ween off use of motrin, need for iron daily anemia discussed. No plans for birth control. SignedLavera Guise 08/17/2011, 9:08 AM

## 2011-08-17 NOTE — Discharge Instructions (Signed)
Cesarean Delivery  °Cesarean delivery is the birth of a baby through a cut (incision) in the abdomen and womb (uterus).  °LET YOUR CAREGIVER KNOW ABOUT: °· Complications involving the pregnancy.  °· Allergies.  °· Medicines taken including herbs, eyedrops, over-the-counter medicines, and creams.  °· Use of steroids (by mouth or creams).  °· Previous problems with anesthetics or numbing medicine.  °· Previous surgery.  °· History of blood clots.  °· History of bleeding or blood problems.  °· Other health problems.  °RISKS AND COMPLICATIONS  °· Bleeding.  °· Infection.  °· Blood clots.  °· Injury to surrounding organs.  °· Anesthesia problems.  °· Injury to the baby.  °BEFORE THE PROCEDURE  °· A tube (Foley catheter) will be placed in your bladder. The Foley catheter drains the urine from your bladder into a bag. This keeps your bladder empty during surgery.  °· An intravenous access tube (IV) will be placed in your arm.  °· Hair may be removed from your pubic area and your lower abdomen. This is to prevent infection in the incision site.  °· You may be given an antacid medicine to drink. This will prevent acid contents in your stomach from going into your lungs if you vomit during the surgery.  °· You may be given an antibiotic medicine to prevent infection.  °PROCEDURE  °· You may be given medicine to numb the lower half of your body (regional anesthetic). If you were in labor, you may have already had an epidural in place which can be used in both labor and cesarean delivery. You may possibly be given medicine to make you sleep (general anesthetic) though this is not as common.  °· An incision will be made in your abdomen that extends to your uterus. There are 2 basic kinds of incisions:  °· The horizontal (transverse) incision. Horizontal incisions are used for most routine cesarean deliveries.  °· The vertical (up and down) incision. This is less commonly used. This is most often reserved for women who have a  serious complication (extreme prematurity) or under emergency situations.   °· The horizontal and vertical incisions may both be used at the same time. However, this is very uncommon.  °· Your baby will then be delivered.  °AFTER THE PROCEDURE  °· If you were awake during the surgery, you will see your baby right away. If you were asleep, you will see your baby as soon as you are awake.  °· You may breastfeed your baby after surgery.  °· You may be able to get up and walk the same day as the surgery. If you need to stay in bed for a period of time, you will receive help to turn, cough, and take deep breaths after surgery. This helps prevent lung problems such as pneumonia.  °· Do not get out of bed alone the first time after surgery. You will need help getting out of bed until you are able to do this by yourself.  °· You may be able to shower the day after your cesarean delivery.  After the bandage (dressing) is taken off the incision site, a nurse will assist you to shower, if you like.   °· You will have pneumatic compressing hose placed on your feet or lower legs. These hose are used to prevent blood clots. When you are up and walking regularly, they will no longer be necessary.   °· Do not cross your legs when you sit.  °· Save any blood clots that you   pass. If you pass a clot while on the toilet, do not flush it. Call for the nurse. Tell the nurse if you think you are bleeding too much or passing too many clots.   Start drinking liquids and eating food as directed by your caregiver. If your stomach is not ready, drinking and eating too soon can cause an increase in bloating and swelling of your intestine and abdomen. This is very uncomfortable.   You will be given medicine as needed. Let your caregivers know if you are hurting. They want you to be comfortable. You may also be given an antibiotic to prevent an infection.   Your IV will be taken out when you are drinking a reasonable amount of fluids. The  Foley catheter is taken out when you are up and walking.   If your blood type is Rh negative and your baby's blood type is Rh positive, you will be given a shot of anti-D immune globulin. This shot prevents you from having Rh problems with a future pregnancy. You should get the shot even if you had your tubes tied (tubal ligation).   If you are allowed to take the baby for a walk, place the baby in the bassinet and push it. Do not carry your baby in your arms.  Document Released: 03/17/2005 Document Revised: 03/06/2011 Document Reviewed: 07/12/2010 [facility] Patient Information 2012 [place], [place]. Anemia, Frequently Asked Questions WHAT ARE THE SYMPTOMS OF ANEMIA?  Headache.   Difficulty thinking.   Fatigue.   Shortness of breath.   Weakness.   Rapid heartbeat.  AT WHAT POINT ARE PEOPLE CONSIDERED ANEMIC?  This varies with gender and age.   Both hemoglobin (Hgb) and hematocrit values are used to define anemia. These lab values are obtained from a complete blood count (CBC) test. This is performed at a caregiver's office.   The normal range of hemoglobin values for adult men is 14.0 g/dL to 40.9 g/dL. For nonpregnant women, values are 12.3 g/dL to 81.1 g/dL.   The World Health Organization defines anemia as less than 12 g/dL for nonpregnant women and less than 13 g/dL for men.   For adult males, the average normal hematocrit is 46%, and the range is 40% to 52%.   For adult females, the average normal hematocrit is 41%, and the range is 35% to 47%.   Values that fall below the lower limits can be a sign of anemia and should have further checking (evaluation).  GROUPS OF PEOPLE WHO ARE AT RISK FOR DEVELOPING ANEMIA INCLUDE:   Infants who are breastfed or taking a formula that is not fortified with iron.   Children going through a rapid growth spurt. The iron available can not keep up with the needs for a red cell mass which must grow with the child.   Women in childbearing  years. They need iron because of blood loss during menstruation.   Pregnant women. The growing fetus creates a high demand for iron.   People with ongoing gastrointestinal blood loss are at risk of developing iron deficiency.   Individuals with leukemia or cancer who must receive chemotherapy or radiation to treat their disease. The drugs or radiation used to treat these diseases often decreases the bone marrow's ability to make cells of all classes. This includes red blood cells, white blood cells, and platelets.   Individuals with chronic inflammatory conditions such as rheumatoid arthritis or chronic infections.   The elderly.  ARE SOME TYPES OF ANEMIA INHERITED?  Yes, some types of anemia are due to inherited or genetic defects.   Sickle cell anemia. This occurs most often in people of African, African American, and Mediterranean descent.   Thalassemia (or Cooley's anemia). This type is found in people of Mediterranean and Southeast Asian descent. These types of anemia are common.   [place]. This is rare.  CAN CERTAIN MEDICATIONS CAUSE A PERSON TO BECOME ANEMIC?  Yes. For example, drugs to fight cancer (chemotherapeutic agents) often cause anemia. These drugs can slow the bone marrow's ability to make red blood cells. If there are not enough red blood cells, the body does not get enough oxygen. WHAT HEMATOCRIT LEVEL IS REQUIRED TO DONATE BLOOD?  The lower limit of an acceptable hematocrit for blood donors is 38%. If you have a low hematocrit value, you should schedule an appointment with your caregiver. ARE BLOOD TRANSFUSIONS COMMONLY USED TO CORRECT ANEMIA, AND ARE THEY DANGEROUS?  They are used to treat anemia as a last resort. Your caregiver will find the cause of the anemia and correct it if possible. Most blood transfusions are given because of excessive bleeding at the time of surgery, with trauma, or because of bone marrow suppression in patients with cancer or leukemia on  chemotherapy. Blood transfusions are safer than ever before. We also know that blood transfusions affect the immune system and may increase certain risks. There is also a concern for human error. In 1/16,000 transfusions, a patient receives a transfusion of blood that is not matched with his or her blood type.  WHAT IS IRON DEFICIENCY ANEMIA AND CAN I CORRECT IT BY CHANGING MY DIET?  Iron is an essential part of hemoglobin. Without enough hemoglobin, anemia develops and the body does not get the right amount of oxygen. Iron deficiency anemia develops after the body has had a low level of iron for a long time. This is either caused by blood loss, not taking in or absorbing enough iron, or increased demands for iron (like pregnancy or rapid growth).  Foods from animal origin such as beef, chicken, and pork, are good sources of iron. Be sure to have one of these foods at each meal. Vitamin C helps your body absorb iron. Foods rich in Vitamin C include citrus, bell pepper, strawberries, spinach and cantaloupe. In some cases, iron supplements may be needed in order to correct the iron deficiency. In the case of poor absorption, extra iron may have to be given directly into the vein through a needle (intravenously). I HAVE BEEN DIAGNOSED WITH IRON DEFICIENCY ANEMIA AND MY CAREGIVER PRESCRIBED IRON SUPPLEMENTS. HOW LONG WILL IT TAKE FOR MY BLOOD TO BECOME NORMAL?  It depends on the degree of anemia at the beginning of treatment. Most people with mild to moderate iron deficiency, anemia will correct the anemia over a period of 2 to 3 months. But after the anemia is corrected, the iron stored by the body is still low. Caregivers often suggest an additional 6 months of oral iron therapy once the anemia has been reversed. This will help prevent the iron deficiency anemia from quickly happening again. Non-anemic adult males should take iron supplements only under the direction of a doctor, too much iron can cause liver  damage.  MY HEMOGLOBIN IS 9 G/DL AND I AM SCHEDULED FOR SURGERY. SHOULD I POSTPONE THE SURGERY?  If you have Hgb of 9, you should discuss this with your caregiver right away. Many patients with similar hemoglobin levels have had surgery without problems. If minimal  blood loss is expected for a minor procedure, no treatment may be necessary.  If a greater blood loss is expected for more extensive procedures, you should ask your caregiver about being treated with erythropoietin and iron. This is to accelerate the recovery of your hemoglobin to a normal level before surgery. An anemic patient who undergoes high-blood-loss surgery has a greater risk of surgical complications and need for a blood transfusion, which also carries some risk.  I HAVE BEEN TOLD THAT HEAVY MENSTRUAL PERIODS CAUSE ANEMIA. IS THERE ANYTHING I CAN DO TO PREVENT THE ANEMIA?  Anemia that results from heavy periods is usually due to iron deficiency. You can try to meet the increased demands for iron caused by the heavy monthly blood loss by increasing the intake of iron-rich foods. Iron supplements may be required. Discuss your concerns with your caregiver. WHAT CAUSES ANEMIA DURING PREGNANCY?  Pregnancy places major demands on the body. The mother must meet the needs of both her body and her growing baby. The body needs enough iron and folate to make the right amount of red blood cells. To prevent anemia while pregnant, the mother should stay in close contact with her caregiver.  Be sure to eat a diet that has foods rich in iron and folate like liver and dark green leafy vegetables. Folate plays an important role in the normal development of a baby's spinal cord. Folate can help prevent serious disorders like spina bifida. If your diet does not provide adequate nutrients, you may want to talk with your caregiver about nutritional supplements.  WHAT IS THE RELATIONSHIP BETWEEN FIBROID TUMORS AND ANEMIA IN WOMEN?  The relationship is  usually caused by the increased menstrual blood loss caused by fibroids. Good iron intake may be required to prevent iron deficiency anemia from developing.  Document Released: 10/24/2003 Document Revised: 03/06/2011 Document Reviewed: 04/09/2010 [facility] Patient Information 2012 [place], [place].Breastfeeding BENEFITS OF BREASTFEEDING For the baby  The first milk (colostrum) helps the baby's digestive system function better.   There are antibodies from the mother in the milk that help the baby fight off infections.   The baby has a lower incidence of asthma, allergies, and SIDS (sudden infant death syndrome).   The nutrients in breast milk are better than formulas for the baby and helps the baby's brain grow better.   Babies who breastfeed have less gas, colic, and constipation.  For the mother  Breastfeeding helps develop a very special bond between mother and baby.   It is more convenient, always available at the correct temperature and cheaper than formula feeding.   It burns calories in the mother and helps with losing weight that was gained during pregnancy.   It makes the uterus contract back down to normal size faster and slows bleeding following delivery.   Breastfeeding mothers have a lower risk of developing breast cancer.  NURSE FREQUENTLY  A healthy, full-term baby may breastfeed as often as every hour or space his or her feedings to every 3 hours.   How often to nurse will vary from baby to baby. Watch your baby for signs of hunger, not the clock.   Nurse as often as the baby requests, or when you feel the need to reduce the fullness of your breasts.   Awaken the baby if it has been 3 to 4 hours since the last feeding.   Frequent feeding will help the mother make more milk and will prevent problems like sore nipples and engorgement of the breasts.  BABY'S POSITION AT THE BREAST  Whether lying down or sitting, be sure that the baby's tummy is facing your tummy.    Support the breast with 4 fingers underneath the breast and the thumb above. Make sure your fingers are well away from the nipple and baby's mouth.   Stroke the baby's lips and cheek closest to the breast gently with your finger or nipple.   When the baby's mouth is open wide enough, place all of your nipple and as much of the dark area around the nipple as possible into your baby's mouth.   Pull the baby in close so the tip of the nose and the baby's cheeks touch the breast during the feeding.  FEEDINGS  The length of each feeding varies from baby to baby and from feeding to feeding.   The baby must suck about 2 to 3 minutes for your milk to get to him or her. This is called a "let down." For this reason, allow the baby to feed on each breast as long as he or she wants. Your baby will end the feeding when he or she has received the right balance of nutrients.   To break the suction, put your finger into the corner of the baby's mouth and slide it between his or her gums before removing your breast from his or her mouth. This will help prevent sore nipples.  REDUCING BREAST ENGORGEMENT  In the first week after your baby is born, you may experience signs of breast engorgement. When breasts are engorged, they feel heavy, warm, full, and may be tender to the touch. You can reduce engorgement if you:   Nurse frequently, every 2 to 3 hours. Mothers who breastfeed early and often have fewer problems with engorgement.   Place light ice packs on your breasts between feedings. This reduces swelling. Wrap the ice packs in a lightweight towel to protect your skin.   Apply moist hot packs to your breast for 5 to 10 minutes before each feeding. This increases circulation and helps the milk flow.   Gently massage your breast before and during the feeding.   Make sure that the baby empties at least one breast at every feeding before switching sides.   Use a breast pump to empty the breasts if your  baby is sleepy or not nursing well. You may also want to pump if you are returning to work or or you feel you are getting engorged.   Avoid bottle feeds, pacifiers or supplemental feedings of water or juice in place of breastfeeding.   Be sure the baby is latched on and positioned properly while breastfeeding.   Prevent fatigue, stress, and anemia.   Wear a supportive bra, avoiding underwire styles.   Eat a balanced diet with enough fluids.  If you follow these suggestions, your engorgement should improve in 24 to 48 hours. If you are still experiencing difficulty, call your lactation consultant or caregiver. IS MY BABY GETTING ENOUGH MILK? Sometimes, mothers worry about whether their babies are getting enough milk. You can be assured that your baby is getting enough milk if:  The baby is actively sucking and you hear swallowing.   The baby nurses at least 8 to 12 times in a 24 hour time period. Nurse your baby until he or she unlatches or falls asleep at the first breast (at least 10 to 20 minutes), then offer the second side.   The baby is wetting 5 to 6 disposable diapers (6  to 8 cloth diapers) in a 24 hour period by 41 to 31 days of age.   The baby is having at least 2 to 3 stools every 24 hours for the first few months. Breast milk is all the food your baby needs. It is not necessary for your baby to have water or formula. In fact, to help your breasts make more milk, it is best not to give your baby supplemental feedings during the early weeks.   The stool should be soft and yellow.   The baby should gain 4 to 7 ounces per week after he is 53 days old.  TAKE CARE OF YOURSELF Take care of your breasts by:  Bathing or showering daily.   Avoiding the use of soaps on your nipples.   Start feedings on your left breast at one feeding and on your right breast at the next feeding.   You will notice an increase in your milk supply 2 to 5 days after delivery. You may feel some discomfort  from engorgement, which makes your breasts very firm and often tender. Engorgement "peaks" out within 24 to 48 hours. In the meantime, apply warm moist towels to your breasts for 5 to 10 minutes before feeding. Gentle massage and expression of some milk before feeding will soften your breasts, making it easier for your baby to latch on. Wear a well fitting nursing bra and air dry your nipples for 10 to 15 minutes after each feeding.   Only use cotton bra pads.   Only use pure lanolin on your nipples after nursing. You do not need to wash it off before nursing.  Take care of yourself by:   Eating well-balanced meals and nutritious snacks.   Drinking milk, fruit juice, and water to satisfy your thirst (about 8 glasses a day).   Getting plenty of rest.   Increasing calcium in your diet (1200 mg a day).   Avoiding foods that you notice affect the baby in a bad way.  SEEK MEDICAL CARE IF:   You have any questions or difficulty with breastfeeding.   You need help.   You have a hard, red, sore area on your breast, accompanied by a fever of 100.5 F (38.1 C) or more.   Your baby is too sleepy to eat well or is having trouble sleeping.   Your baby is wetting less than 6 diapers per day, by 80 days of age.   Your baby's skin or white part of his or her eyes is more yellow than it was in the hospital.   You feel depressed.  Document Released: 03/17/2005 Document Revised: 03/06/2011 Document Reviewed: 10/30/2008 [facility] Patient Information 2012 [place], [place].

## 2011-08-18 ENCOUNTER — Telehealth: Payer: Self-pay | Admitting: Obstetrics and Gynecology

## 2011-08-18 NOTE — Telephone Encounter (Signed)
Chandra/vph surg

## 2011-08-18 NOTE — Telephone Encounter (Signed)
Tc to pt per telephone call. Pt states,"has a small rash at the area of where tape was removed from over the incision". Informed pt can try otc hydrocortisone cream. If no improvement, pt to call office. Pt voices understanding.

## 2011-08-18 NOTE — Telephone Encounter (Signed)
vph pt 

## 2011-08-21 ENCOUNTER — Encounter (HOSPITAL_COMMUNITY): Payer: BC Managed Care – PPO

## 2011-09-15 ENCOUNTER — Encounter (HOSPITAL_COMMUNITY): Payer: BC Managed Care – PPO

## 2011-09-17 ENCOUNTER — Ambulatory Visit: Payer: BC Managed Care – PPO | Admitting: Obstetrics and Gynecology

## 2011-09-17 ENCOUNTER — Encounter (HOSPITAL_COMMUNITY)
Admission: RE | Admit: 2011-09-17 | Discharge: 2011-09-17 | Disposition: A | Discharge status: Home / Self Care | Payer: BC Managed Care – PPO | Source: Ambulatory Visit | Attending: Obstetrics and Gynecology | Admitting: Obstetrics and Gynecology

## 2011-09-17 DIAGNOSIS — O923 Agalactia: Secondary | ICD-10-CM | POA: Insufficient documentation

## 2011-09-22 ENCOUNTER — Encounter: Payer: Self-pay | Admitting: Obstetrics and Gynecology

## 2011-09-22 ENCOUNTER — Ambulatory Visit (INDEPENDENT_AMBULATORY_CARE_PROVIDER_SITE_OTHER): Payer: BC Managed Care – PPO | Admitting: Obstetrics and Gynecology

## 2011-09-22 NOTE — Progress Notes (Signed)
Date of delivery: 08/14/2011 Female Name: [name] Vaginal delivery:no Cesarean section:yes Tubal ligation:no GDM:no Breast Feeding:yes Bottle Feeding:yes Post-Partum Blues:no Abnormal pap:yes Normal GU function: yes Normal GI function:yes Returning to work:no EPDS: 6  Subjective:     Alexandria Schneider is a 31 y.o. female who presents for a postpartum visit.  I have fully reviewed the prenatal and intrapartum course. See information above. She had a repeat cesarean section and is doing very well at this point.  She is pumping her breasts.   Patient is not sexually active.   The following portions of the patient's history were reviewed and updated as appropriate: allergies, current medications, past family history, past medical history, past social history, past surgical history and problem list.  Review of Systems Pertinent items are noted in HPI.   Objective:    BP 106/60  Temp 98.6 F (37 C)  Resp 16  Ht 5\' 9"  (1.753 m)  Wt 139 lb (63.05 kg)  BMI 20.53 kg/m2  Breastfeeding? Yes  General:  alert, cooperative and no distress     Lungs: clear to auscultation bilaterally  Heart:  regular rate and rhythm, S1, S2 normal, no murmur  Abdomen: soft, non-tender; bowel sounds normal; no masses,  no organomegaly   Vulva:  normal  Vagina: normal vagina  Cervix:  normal  Corpus: normal size, contour, position, consistency, mobility, non-tender  Adnexa:  normal adnexa        Cesarean section incision well healed: yes       Assessment:     Normal postpartum exam.  Pap smear not done at today's visit.   Plan:     1. Contraception: condoms. 2. EPDS: 6. 3. Follow up in: 3 months or as needed. 4. Breast Feeding: No. Pumping. 5. Return to work, exercising, and a normal activities: yes.     Janine Limbo MD 09/22/2011 10:08 AM

## 2011-10-14 ENCOUNTER — Telehealth: Payer: Self-pay | Admitting: Obstetrics and Gynecology

## 2011-10-14 NOTE — Telephone Encounter (Signed)
Chandra/vph pt °

## 2011-10-14 NOTE — Telephone Encounter (Signed)
Lm on vm to cb per telephone call.  

## 2011-10-14 NOTE — Telephone Encounter (Signed)
Tc from pt per telephone call. Pt wants to know if ok to use Lotrisone cream on breast while breastfeeding. Consulted with chs, ok to use cream after feeding;no more than 2-3 times daily. Pt voices understanding.

## 2011-10-18 ENCOUNTER — Encounter (HOSPITAL_COMMUNITY)
Admission: RE | Admit: 2011-10-18 | Discharge: 2011-10-18 | Disposition: A | Discharge status: Home / Self Care | Payer: BC Managed Care – PPO | Source: Ambulatory Visit | Attending: Obstetrics and Gynecology | Admitting: Obstetrics and Gynecology

## 2011-10-18 DIAGNOSIS — O923 Agalactia: Secondary | ICD-10-CM | POA: Insufficient documentation

## 2011-11-07 ENCOUNTER — Telehealth: Payer: Self-pay | Admitting: Obstetrics and Gynecology

## 2011-11-07 NOTE — Telephone Encounter (Signed)
TRIAGE/EPIC °

## 2011-11-07 NOTE — Telephone Encounter (Signed)
Pt called, states she has had a breast yeast infection.  Is 12 wks PP, daughter had thrush, then was rx'd antibxs for sinus infection and yeast on breasts had gotten worse.  Saw PCP and was rx'd Diflucan Q day for 6 days and has not filled rx yet.  Wants to know if safe to take the Diflucan and if it is ok to take it that way.  Advised per [facility], ok to take the Diflucan as rx'd and use Nystatin cream (pt states was given rx for that by PCP as well).  If no relief pt advised to call back, pt voices understanding.

## 2011-11-11 ENCOUNTER — Encounter: Payer: Self-pay | Admitting: Obstetrics and Gynecology

## 2011-11-11 ENCOUNTER — Telehealth: Payer: Self-pay | Admitting: Obstetrics and Gynecology

## 2011-11-11 ENCOUNTER — Ambulatory Visit (INDEPENDENT_AMBULATORY_CARE_PROVIDER_SITE_OTHER): Payer: BC Managed Care – PPO | Admitting: Obstetrics and Gynecology

## 2011-11-11 VITALS — BP 110/70 | Wt 139.0 lb

## 2011-11-11 DIAGNOSIS — N61 Mastitis without abscess: Secondary | ICD-10-CM

## 2011-11-11 DIAGNOSIS — F4323 Adjustment disorder with mixed anxiety and depressed mood: Secondary | ICD-10-CM

## 2011-11-11 MED ORDER — SERTRALINE HCL 50 MG PO TABS: 50.0000 mg | ORAL_TABLET | Freq: Every day | ORAL | Status: DC

## 2011-11-11 NOTE — Progress Notes (Signed)
HISTORY OF PRESENT ILLNESS  [name] is a 31 y.o. year old female,G3P2012, who presents for a problem visit. The patient had a baby 12 weeks ago.  She finds that she is overwhelmed with responsibilities of 2 children.  She is pumping her breasts 5 or more times each day and this is difficult for her.  She reports having a straight rash on her breast.  She has been given Diflucan by her family physician.  Nystatin paste is difficult for her to manage.  Subjective:  The patient has a history of depression.  She has been treated with Zoloft in the past.  The patient denies suicidal and homicidal thoughts.  She often finds herself being angry.  She feels guilty about not being able to do all the things that she wants to be able to do.  Objective:  BP 110/70  Wt 139 lb (63.05 kg)  Breastfeeding? Yes   breasts: Red rash present.  Consistent with teeth.  Exam deferred.  Assessment:  Depression.  Yeast on breast.  Overwhelmed with 2 children.  Plan:  A long discussion was held with the patient and her husband.  They were referred to a counselor.  We will began Zoloft 50 mg each day.  Return to office in 2 weeks.  Nystatin cream call to [facility] per patient request.  Return to office in 2 week(s).  Leonard Schwartz M.D.  11/11/2011 6:33 PM

## 2011-11-18 ENCOUNTER — Encounter (HOSPITAL_COMMUNITY)
Admission: RE | Admit: 2011-11-18 | Discharge: 2011-11-18 | Disposition: A | Discharge status: Home / Self Care | Payer: BC Managed Care – PPO | Source: Ambulatory Visit | Attending: Obstetrics and Gynecology | Admitting: Obstetrics and Gynecology

## 2011-11-18 DIAGNOSIS — O923 Agalactia: Secondary | ICD-10-CM | POA: Insufficient documentation

## 2011-11-26 ENCOUNTER — Ambulatory Visit (INDEPENDENT_AMBULATORY_CARE_PROVIDER_SITE_OTHER): Payer: BC Managed Care – PPO | Admitting: Obstetrics and Gynecology

## 2011-11-26 ENCOUNTER — Encounter: Payer: Self-pay | Admitting: Obstetrics and Gynecology

## 2011-11-26 VITALS — BP 112/72 | Resp 14 | Ht 69.0 in | Wt 135.0 lb

## 2011-11-26 DIAGNOSIS — F53 Postpartum depression: Secondary | ICD-10-CM

## 2011-11-26 DIAGNOSIS — O99345 Other mental disorders complicating the puerperium: Secondary | ICD-10-CM

## 2011-11-26 NOTE — Progress Notes (Signed)
HISTORY OF PRESENT ILLNESS  [name] is a 31 y.o. year old female,G3P2012, who presents for a problem visit. The patient had a cesarean section on Aug 14, 2011.  Her postpartum course was complicated by increasing depression.  She has a history of depression.  She was started on Zoloft 2 weeks ago.  Subjective:  Feels much better.  Counseling has been arranged.  Her husband may start on Zoloft as well. Breasts yeast has resolved.  Objective:  BP 112/72  Resp 14  Ht 5\' 9"  (1.753 m)  Wt 135 lb (61.236 kg)  BMI 19.94 kg/m2  Breastfeeding? Yes   General: alert and no distress  Exam deferred.  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 Improved yeast infection on her breast  Plan:  Return for annual exam in 2 months. Continue Zoloft 50 mg each day. Continue counseling  Return to office prn if symptoms worsen or fail to improve.   Alexandria Schneider M.D.  11/26/2011 11:04 AM

## 2011-12-19 ENCOUNTER — Encounter (HOSPITAL_COMMUNITY)
Admission: RE | Admit: 2011-12-19 | Discharge: 2011-12-19 | Disposition: A | Discharge status: Home / Self Care | Payer: BC Managed Care – PPO | Source: Ambulatory Visit | Attending: Obstetrics and Gynecology | Admitting: Obstetrics and Gynecology

## 2011-12-19 DIAGNOSIS — O923 Agalactia: Secondary | ICD-10-CM | POA: Insufficient documentation

## 2012-01-19 ENCOUNTER — Encounter (HOSPITAL_COMMUNITY)
Admission: RE | Admit: 2012-01-19 | Discharge: 2012-01-19 | Disposition: A | Discharge status: Home / Self Care | Payer: BC Managed Care – PPO | Source: Ambulatory Visit | Attending: Obstetrics and Gynecology | Admitting: Obstetrics and Gynecology

## 2012-01-19 DIAGNOSIS — O923 Agalactia: Secondary | ICD-10-CM | POA: Insufficient documentation

## 2012-02-19 ENCOUNTER — Encounter (HOSPITAL_COMMUNITY)
Admission: RE | Admit: 2012-02-19 | Discharge: 2012-02-19 | Disposition: A | Discharge status: Home / Self Care | Payer: BC Managed Care – PPO | Source: Ambulatory Visit | Attending: Obstetrics and Gynecology | Admitting: Obstetrics and Gynecology

## 2012-02-19 DIAGNOSIS — O923 Agalactia: Secondary | ICD-10-CM | POA: Insufficient documentation

## 2012-03-20 ENCOUNTER — Encounter (HOSPITAL_COMMUNITY)
Admission: RE | Admit: 2012-03-20 | Discharge: 2012-03-20 | Disposition: A | Discharge status: Home / Self Care | Payer: BC Managed Care – PPO | Source: Ambulatory Visit | Attending: Obstetrics and Gynecology | Admitting: Obstetrics and Gynecology

## 2012-03-20 DIAGNOSIS — O923 Agalactia: Secondary | ICD-10-CM | POA: Insufficient documentation

## 2012-04-02 ENCOUNTER — Other Ambulatory Visit: Payer: Self-pay | Admitting: Internal Medicine

## 2012-04-02 DIAGNOSIS — E049 Nontoxic goiter, unspecified: Secondary | ICD-10-CM

## 2012-04-07 ENCOUNTER — Ambulatory Visit
Admission: RE | Admit: 2012-04-07 | Discharge: 2012-04-07 | Disposition: A | Discharge status: Home / Self Care | Payer: BC Managed Care – PPO | Source: Ambulatory Visit | Attending: Internal Medicine | Admitting: Internal Medicine

## 2012-04-07 DIAGNOSIS — E049 Nontoxic goiter, unspecified: Secondary | ICD-10-CM

## 2012-04-20 ENCOUNTER — Encounter (HOSPITAL_COMMUNITY)
Admission: RE | Admit: 2012-04-20 | Discharge: 2012-04-20 | Disposition: A | Discharge status: Home / Self Care | Payer: BC Managed Care – PPO | Source: Ambulatory Visit | Attending: Obstetrics and Gynecology | Admitting: Obstetrics and Gynecology

## 2012-04-20 DIAGNOSIS — O923 Agalactia: Secondary | ICD-10-CM | POA: Insufficient documentation

## 2012-05-21 ENCOUNTER — Encounter (HOSPITAL_COMMUNITY)
Admission: RE | Admit: 2012-05-21 | Discharge: 2012-05-21 | Disposition: A | Discharge status: Home / Self Care | Payer: BC Managed Care – PPO | Source: Ambulatory Visit | Attending: Obstetrics and Gynecology | Admitting: Obstetrics and Gynecology

## 2012-05-21 DIAGNOSIS — O923 Agalactia: Secondary | ICD-10-CM | POA: Insufficient documentation

## 2012-06-19 ENCOUNTER — Encounter (HOSPITAL_COMMUNITY)
Admission: RE | Admit: 2012-06-19 | Discharge: 2012-06-19 | Disposition: A | Discharge status: Home / Self Care | Payer: BC Managed Care – PPO | Source: Ambulatory Visit | Attending: Obstetrics and Gynecology | Admitting: Obstetrics and Gynecology

## 2012-06-19 DIAGNOSIS — O923 Agalactia: Secondary | ICD-10-CM | POA: Insufficient documentation

## 2012-07-20 ENCOUNTER — Encounter (HOSPITAL_COMMUNITY)
Admission: RE | Admit: 2012-07-20 | Discharge: 2012-07-20 | Disposition: A | Discharge status: Home / Self Care | Payer: BC Managed Care – PPO | Source: Ambulatory Visit | Attending: Obstetrics and Gynecology | Admitting: Obstetrics and Gynecology

## 2012-07-20 DIAGNOSIS — O923 Agalactia: Secondary | ICD-10-CM | POA: Insufficient documentation

## 2012-08-09 IMAGING — US US TRANSVAGINAL NON-OB
1 series · 14 of 25 positions shown · non-contrast
Comparison: None.

CLINICAL DATA: Pelvic pain.  Rule out ovarian cyst



[Series 1: us pelvis complete · 14 of 70 slices shown]
[im 1/70]
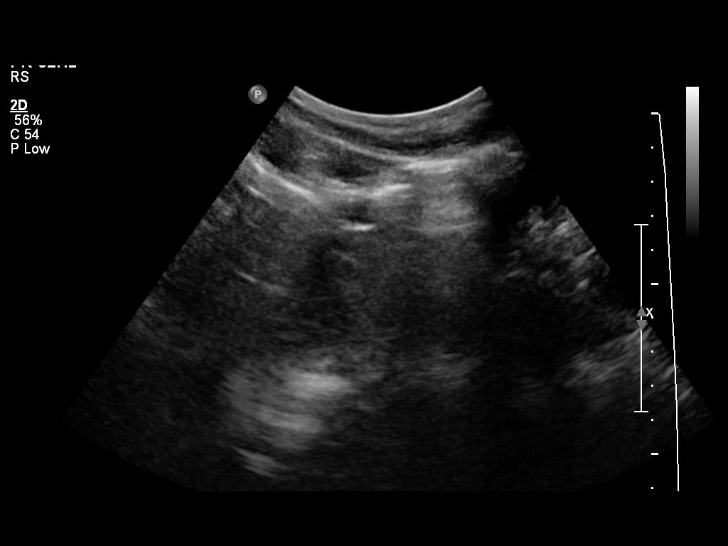
[im 6/70]
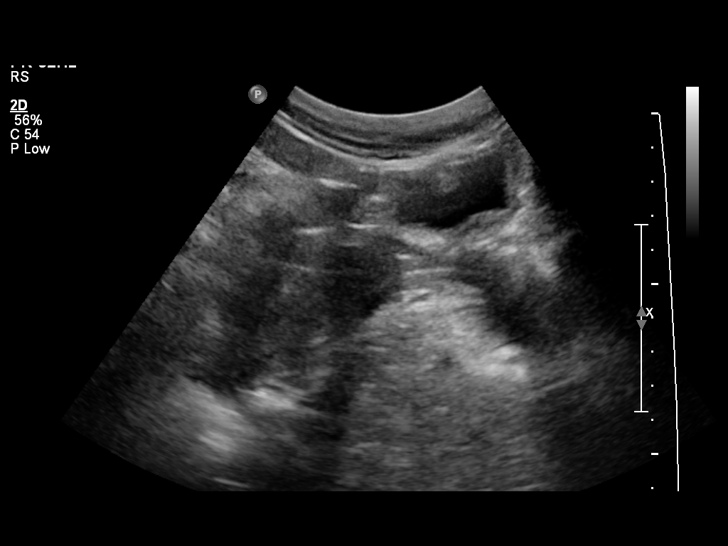
[im 12/70]
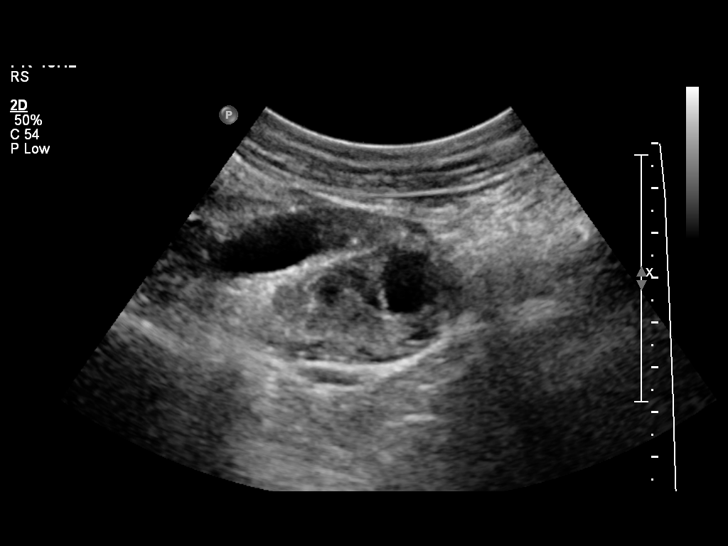
[im 18/70]
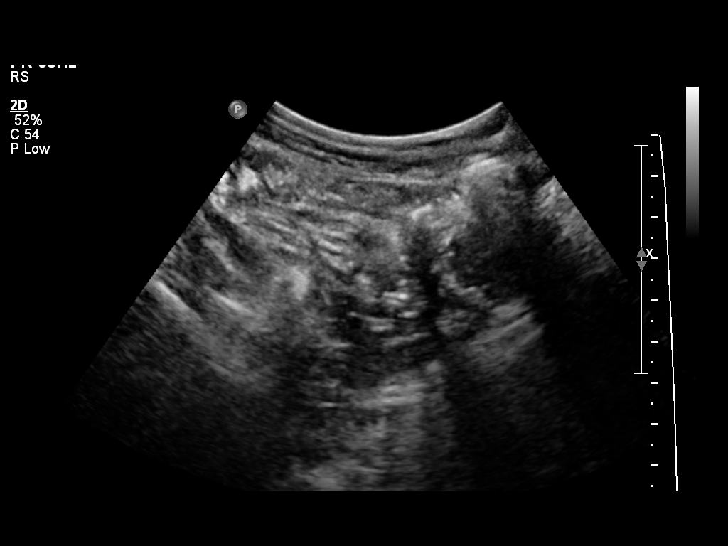
[im 24/70]
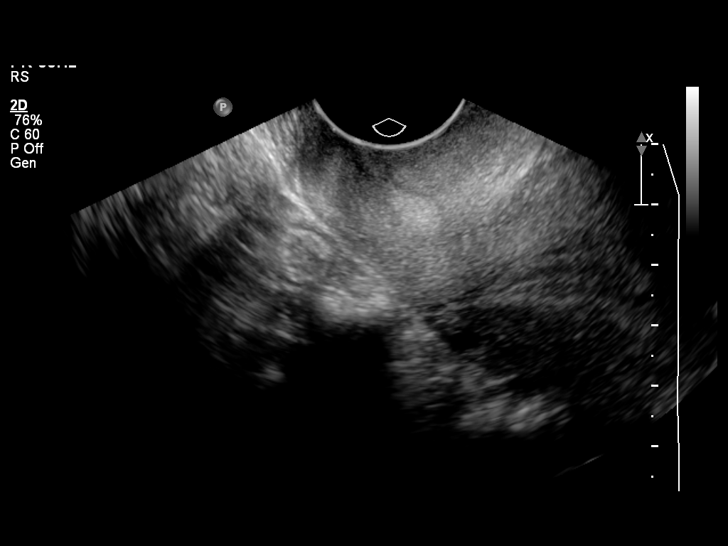
[im 26/70]
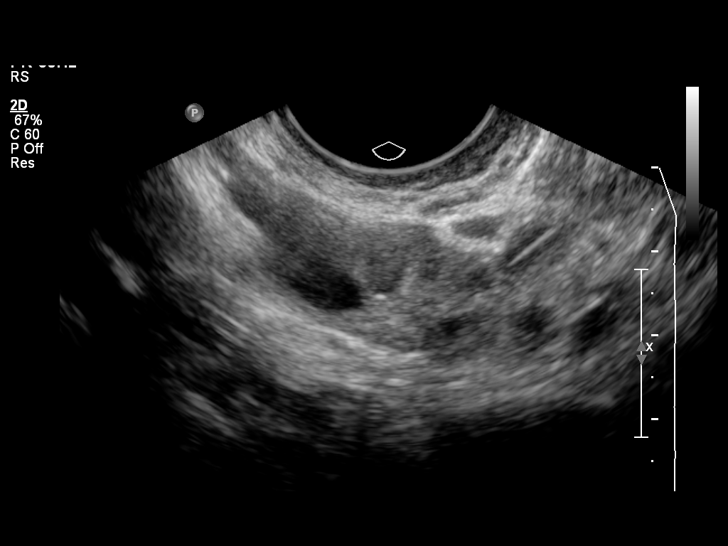
[im 32/70]
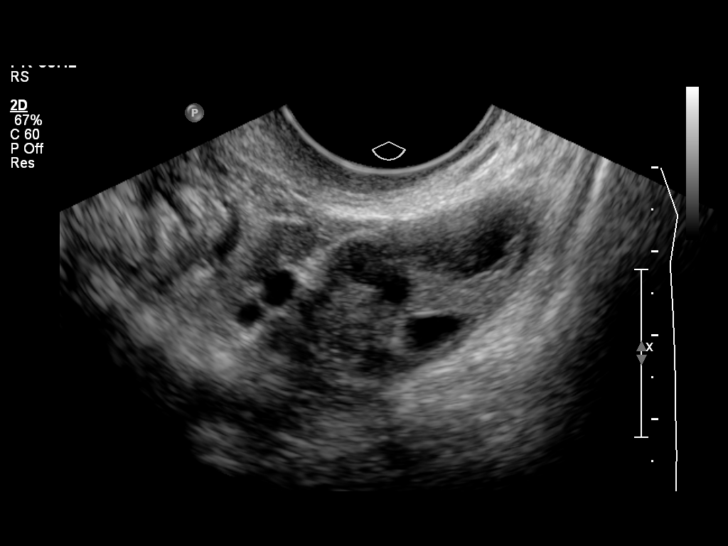
[im 38/70]
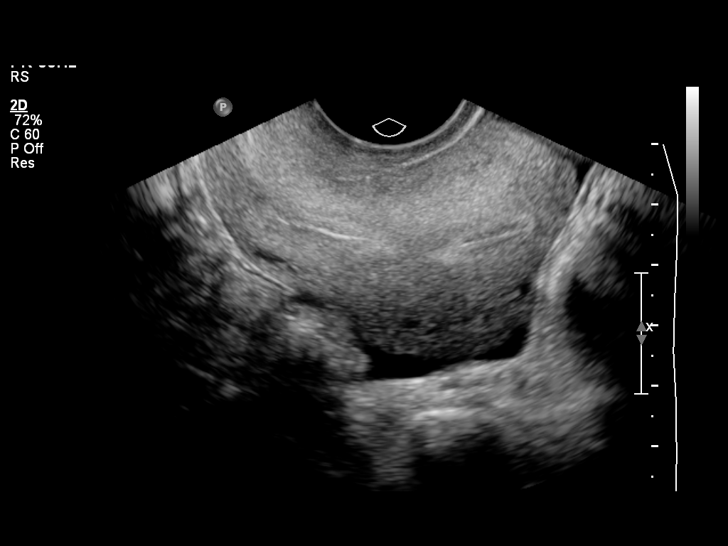
[im 44/70]
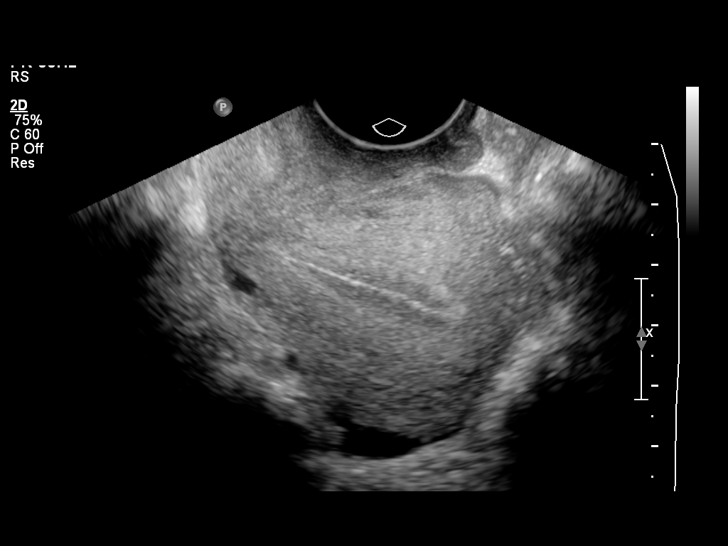
[im 47/70]
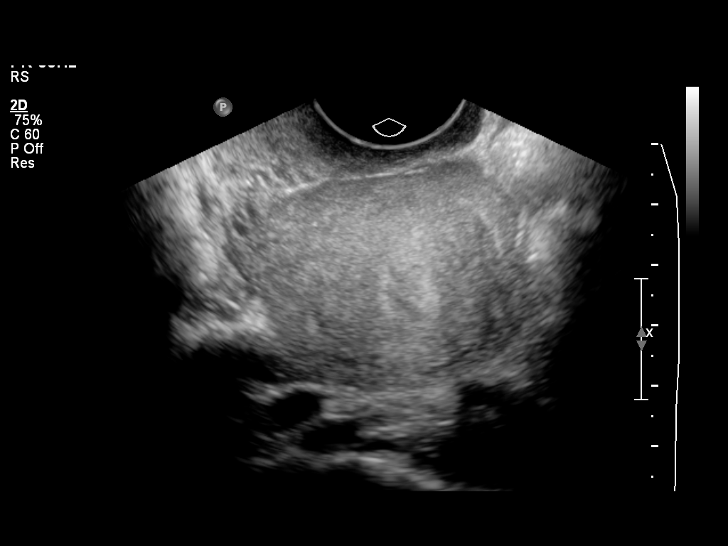
[im 52/70]
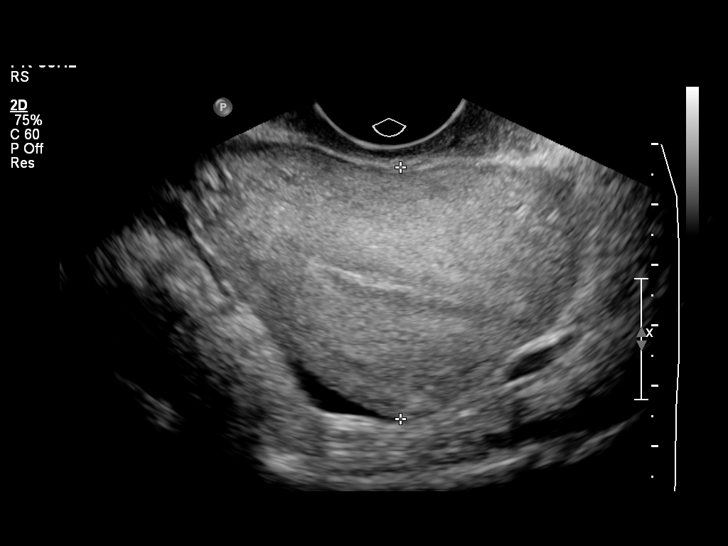
[im 58/70]
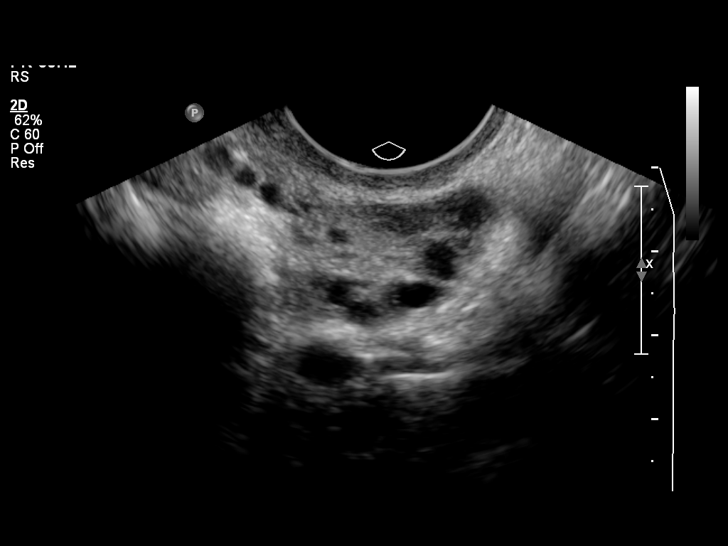
[im 64/70]
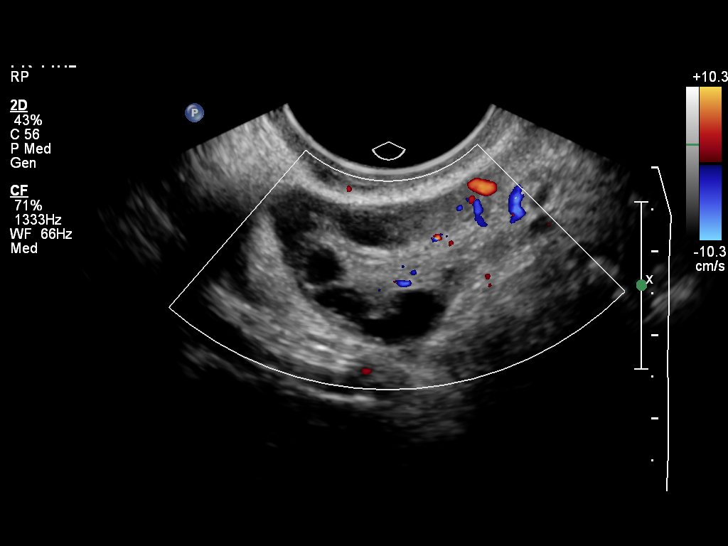
[im 70/70]
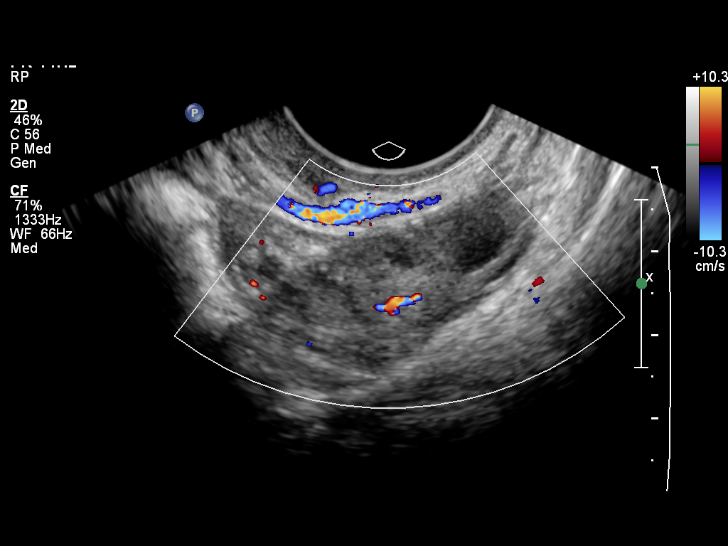

[14 of 25 positions shown; findings below may reference images not displayed]

FINDINGS: Uterus:  The uterus is retroverted measuring 7.7 x 4.2 x 5.8 cm.
No fibroids or other uterine masses identified.

Endometrium:  The endometrium appears normal measuring 13 mm in
thickness.

Right ovary:  The right ovary is normal measuring 2.7 x 2.0 x
cm. Normal appearance/no adnexal mass.

Left ovary:  The left ovary is normal measuring 3.6 x 1.8 x
cm.  Normal appearance/no adnexal mass.

Other findings:  Color Doppler images demonstrate blood flow in
both kidneys.
IMPRESSION: Normal study.  No evidence of pelvic mass or other significant
abnormality.

## 2012-08-20 ENCOUNTER — Encounter (HOSPITAL_COMMUNITY)
Admission: RE | Admit: 2012-08-20 | Discharge: 2012-08-20 | Disposition: A | Discharge status: Home / Self Care | Payer: BC Managed Care – PPO | Source: Ambulatory Visit | Attending: Obstetrics and Gynecology | Admitting: Obstetrics and Gynecology

## 2012-08-20 DIAGNOSIS — O923 Agalactia: Secondary | ICD-10-CM | POA: Insufficient documentation

## 2012-09-20 ENCOUNTER — Encounter (HOSPITAL_COMMUNITY)
Admission: RE | Admit: 2012-09-20 | Discharge: 2012-09-20 | Disposition: A | Discharge status: Home / Self Care | Payer: BC Managed Care – PPO | Source: Ambulatory Visit | Attending: Obstetrics and Gynecology | Admitting: Obstetrics and Gynecology

## 2012-09-20 DIAGNOSIS — O923 Agalactia: Secondary | ICD-10-CM | POA: Insufficient documentation

## 2012-10-20 ENCOUNTER — Encounter (HOSPITAL_COMMUNITY)
Admission: RE | Admit: 2012-10-20 | Discharge: 2012-10-20 | Disposition: A | Discharge status: Home / Self Care | Payer: BC Managed Care – PPO | Source: Ambulatory Visit | Attending: Obstetrics and Gynecology | Admitting: Obstetrics and Gynecology

## 2012-10-20 DIAGNOSIS — O923 Agalactia: Secondary | ICD-10-CM | POA: Insufficient documentation

## 2012-11-20 ENCOUNTER — Encounter (HOSPITAL_COMMUNITY)
Admission: RE | Admit: 2012-11-20 | Discharge: 2012-11-20 | Disposition: A | Discharge status: Home / Self Care | Payer: BC Managed Care – PPO | Source: Ambulatory Visit | Attending: Obstetrics and Gynecology | Admitting: Obstetrics and Gynecology

## 2012-11-20 DIAGNOSIS — O923 Agalactia: Secondary | ICD-10-CM | POA: Insufficient documentation

## 2012-12-21 ENCOUNTER — Encounter (HOSPITAL_COMMUNITY)
Admission: RE | Admit: 2012-12-21 | Discharge: 2012-12-21 | Disposition: A | Discharge status: Home / Self Care | Payer: BC Managed Care – PPO | Source: Ambulatory Visit | Attending: Obstetrics and Gynecology | Admitting: Obstetrics and Gynecology

## 2012-12-21 DIAGNOSIS — O923 Agalactia: Secondary | ICD-10-CM | POA: Insufficient documentation

## 2014-01-30 ENCOUNTER — Encounter: Payer: Self-pay | Admitting: Obstetrics and Gynecology

## 2014-02-16 ENCOUNTER — Inpatient Hospital Stay (HOSPITAL_COMMUNITY): Payer: BC Managed Care – PPO | Admitting: Anesthesiology

## 2014-02-16 ENCOUNTER — Encounter (HOSPITAL_COMMUNITY): Payer: Self-pay | Admitting: *Deleted

## 2014-02-16 ENCOUNTER — Encounter (HOSPITAL_COMMUNITY)
Admission: AD | Disposition: A | Discharge status: Home / Self Care | Payer: Self-pay | Source: Ambulatory Visit | Attending: Obstetrics & Gynecology

## 2014-02-16 ENCOUNTER — Inpatient Hospital Stay (HOSPITAL_COMMUNITY)
Admission: AD | Admit: 2014-02-16 | Discharge: 2014-02-16 | Disposition: A | Discharge status: Home / Self Care | Payer: BC Managed Care – PPO | Source: Ambulatory Visit | Attending: Obstetrics & Gynecology | Admitting: Obstetrics & Gynecology

## 2014-02-16 ENCOUNTER — Inpatient Hospital Stay (HOSPITAL_COMMUNITY): Payer: BC Managed Care – PPO

## 2014-02-16 DIAGNOSIS — Z87891 Personal history of nicotine dependence: Secondary | ICD-10-CM | POA: Diagnosis not present

## 2014-02-16 DIAGNOSIS — O021 Missed abortion: Secondary | ICD-10-CM | POA: Diagnosis present

## 2014-02-16 DIAGNOSIS — E0580 Other thyrotoxicosis without thyrotoxic crisis or storm: Secondary | ICD-10-CM | POA: Insufficient documentation

## 2014-02-16 DIAGNOSIS — Z3A01 Less than 8 weeks gestation of pregnancy: Secondary | ICD-10-CM | POA: Diagnosis present

## 2014-02-16 DIAGNOSIS — O99281 Endocrine, nutritional and metabolic diseases complicating pregnancy, first trimester: Secondary | ICD-10-CM | POA: Insufficient documentation

## 2014-02-16 DIAGNOSIS — O09521 Supervision of elderly multigravida, first trimester: Secondary | ICD-10-CM

## 2014-02-16 DIAGNOSIS — O039 Complete or unspecified spontaneous abortion without complication: Secondary | ICD-10-CM | POA: Diagnosis present

## 2014-02-16 DIAGNOSIS — Z3A10 10 weeks gestation of pregnancy: Secondary | ICD-10-CM | POA: Diagnosis not present

## 2014-02-16 DIAGNOSIS — O209 Hemorrhage in early pregnancy, unspecified: Secondary | ICD-10-CM | POA: Diagnosis present

## 2014-02-16 DIAGNOSIS — O3421 Maternal care for scar from previous cesarean delivery: Secondary | ICD-10-CM | POA: Insufficient documentation

## 2014-02-16 HISTORY — DX: Unspecified infectious disease: B99.9

## 2014-02-16 HISTORY — DX: Unspecified ovarian cyst, unspecified side: N83.209

## 2014-02-16 HISTORY — DX: Endometriosis, unspecified: N80.9

## 2014-02-16 HISTORY — DX: Anemia, unspecified: D64.9

## 2014-02-16 HISTORY — DX: Unspecified abnormal cytological findings in specimens from vagina: R87.629

## 2014-02-16 HISTORY — PX: DILATION AND EVACUATION: SHX1459

## 2014-02-16 LAB — TYPE AND SCREEN
ABO/RH(D): B POS
Antibody Screen: NEGATIVE

## 2014-02-16 LAB — CBC
HEMATOCRIT: 38.2 % (ref 36.0–46.0)
HEMOGLOBIN: 12.9 g/dL (ref 12.0–15.0)
MCH: 29.6 pg (ref 26.0–34.0)
MCHC: 33.8 g/dL (ref 30.0–36.0)
MCV: 87.6 fL (ref 78.0–100.0)
Platelets: 213 10*3/uL (ref 150–400)
RBC: 4.36 MIL/uL (ref 3.87–5.11)
RDW: 14.2 % (ref 11.5–15.5)
WBC: 6.6 10*3/uL (ref 4.0–10.5)

## 2014-02-16 LAB — URINALYSIS, ROUTINE W REFLEX MICROSCOPIC
BILIRUBIN URINE: NEGATIVE
Glucose, UA: NEGATIVE mg/dL
Ketones, ur: NEGATIVE mg/dL
NITRITE: NEGATIVE
PROTEIN: NEGATIVE mg/dL
SPECIFIC GRAVITY, URINE: 1.015 (ref 1.005–1.030)
UROBILINOGEN UA: 0.2 mg/dL (ref 0.0–1.0)
pH: 6.5 (ref 5.0–8.0)

## 2014-02-16 LAB — URINE MICROSCOPIC-ADD ON

## 2014-02-16 LAB — HCG, QUANTITATIVE, PREGNANCY: HCG, BETA CHAIN, QUANT, S: 8108 m[IU]/mL — AB (ref <5)

## 2014-02-16 LAB — ABO/RH: ABO/RH(D): B POS

## 2014-02-16 SURGERY — DILATION AND EVACUATION, UTERUS
Anesthesia: Monitor Anesthesia Care | Site: Vagina

## 2014-02-16 SURGERY — DILATION AND EVACUATION, UTERUS
Anesthesia: Choice

## 2014-02-16 MED ORDER — FENTANYL CITRATE 0.05 MG/ML IJ SOLN
INTRAMUSCULAR | Status: AC
  Filled 2014-02-16: qty 2

## 2014-02-16 MED ORDER — OXYCODONE-ACETAMINOPHEN 5-325 MG PO TABS: 1.0000 | ORAL_TABLET | ORAL | Status: DC | PRN

## 2014-02-16 MED ORDER — LACTATED RINGERS IV SOLN: INTRAVENOUS | Status: DC

## 2014-02-16 MED ORDER — PROPOFOL INFUSION 10 MG/ML OPTIME
INTRAVENOUS | Status: DC | PRN
  Administered 2014-02-16: 100 ug/kg/min via INTRAVENOUS

## 2014-02-16 MED ORDER — OXYCODONE-ACETAMINOPHEN 5-325 MG PO TABS
1.0000 | ORAL_TABLET | Freq: Once | ORAL | Status: AC
Start: 2014-02-16 — End: 2014-02-16
  Administered 2014-02-16: 1 via ORAL

## 2014-02-16 MED ORDER — ONDANSETRON HCL 4 MG/2ML IJ SOLN: 4.0000 mg | Freq: Once | INTRAMUSCULAR | Status: DC | PRN

## 2014-02-16 MED ORDER — FAMOTIDINE IN NACL 20-0.9 MG/50ML-% IV SOLN
20.0000 mg | Freq: Once | INTRAVENOUS | Status: AC
  Administered 2014-02-16: 20 mg via INTRAVENOUS
  Filled 2014-02-16: qty 50

## 2014-02-16 MED ORDER — ONDANSETRON HCL 4 MG/2ML IJ SOLN
INTRAMUSCULAR | Status: DC | PRN
  Administered 2014-02-16: 4 mg via INTRAVENOUS

## 2014-02-16 MED ORDER — MIDAZOLAM HCL 2 MG/2ML IJ SOLN
INTRAMUSCULAR | Status: AC
  Filled 2014-02-16: qty 2

## 2014-02-16 MED ORDER — ACETAMINOPHEN 325 MG PO TABS: 325.0000 mg | ORAL_TABLET | ORAL | Status: DC | PRN

## 2014-02-16 MED ORDER — GLYCOPYRROLATE 0.2 MG/ML IJ SOLN
INTRAMUSCULAR | Status: DC | PRN
  Administered 2014-02-16: 0.1 mg via INTRAVENOUS

## 2014-02-16 MED ORDER — MIDAZOLAM HCL 2 MG/2ML IJ SOLN
INTRAMUSCULAR | Status: DC | PRN
  Administered 2014-02-16: 2 mg via INTRAVENOUS

## 2014-02-16 MED ORDER — ONDANSETRON HCL 4 MG/2ML IJ SOLN
INTRAMUSCULAR | Status: AC
  Filled 2014-02-16: qty 2

## 2014-02-16 MED ORDER — DOXYCYCLINE HYCLATE 100 MG IV SOLR
100.0000 mg | Freq: Once | INTRAVENOUS | Status: AC
  Administered 2014-02-16: 100 mg via INTRAVENOUS
  Filled 2014-02-16: qty 100

## 2014-02-16 MED ORDER — DEXAMETHASONE SODIUM PHOSPHATE 10 MG/ML IJ SOLN
INTRAMUSCULAR | Status: DC | PRN
  Administered 2014-02-16: 4 mg via INTRAVENOUS

## 2014-02-16 MED ORDER — DIPHENHYDRAMINE HCL 50 MG/ML IJ SOLN
INTRAMUSCULAR | Status: DC | PRN
  Administered 2014-02-16 (×2): 12.5 mg via INTRAVENOUS

## 2014-02-16 MED ORDER — ACETAMINOPHEN 325 MG PO TABS
650.0000 mg | ORAL_TABLET | Freq: Once | ORAL | Status: AC
  Administered 2014-02-16: 650 mg via ORAL
  Filled 2014-02-16: qty 2

## 2014-02-16 MED ORDER — LIDOCAINE HCL (CARDIAC) 20 MG/ML IV SOLN
INTRAVENOUS | Status: DC | PRN
  Administered 2014-02-16: 80 mg via INTRAVENOUS

## 2014-02-16 MED ORDER — IBUPROFEN 800 MG PO TABS: 800.0000 mg | ORAL_TABLET | Freq: Three times a day (TID) | ORAL | Status: DC | PRN

## 2014-02-16 MED ORDER — DOXYCYCLINE HYCLATE 100 MG PO CAPS: ORAL_CAPSULE | ORAL | Status: DC

## 2014-02-16 MED ORDER — MEPERIDINE HCL 25 MG/ML IJ SOLN: 6.2500 mg | INTRAMUSCULAR | Status: DC | PRN

## 2014-02-16 MED ORDER — KETOROLAC TROMETHAMINE 30 MG/ML IJ SOLN
INTRAMUSCULAR | Status: DC | PRN
  Administered 2014-02-16: 30 mg via INTRAVENOUS

## 2014-02-16 MED ORDER — FENTANYL CITRATE 0.05 MG/ML IJ SOLN
25.0000 ug | INTRAMUSCULAR | Status: DC | PRN
  Administered 2014-02-16: 50 ug via INTRAVENOUS

## 2014-02-16 MED ORDER — CITRIC ACID-SODIUM CITRATE 334-500 MG/5ML PO SOLN
30.0000 mL | Freq: Once | ORAL | Status: AC
  Administered 2014-02-16: 30 mL via ORAL
  Filled 2014-02-16: qty 15

## 2014-02-16 MED ORDER — LIDOCAINE HCL (CARDIAC) 20 MG/ML IV SOLN
INTRAVENOUS | Status: AC
  Filled 2014-02-16: qty 5

## 2014-02-16 MED ORDER — LACTATED RINGERS IV SOLN
INTRAVENOUS | Status: DC
  Administered 2014-02-16 (×2): via INTRAVENOUS

## 2014-02-16 MED ORDER — 0.9 % SODIUM CHLORIDE (POUR BTL) OPTIME
TOPICAL | Status: DC | PRN
  Administered 2014-02-16: 1000 mL

## 2014-02-16 MED ORDER — LACTATED RINGERS IV BOLUS (SEPSIS)
1000.0000 mL | Freq: Once | INTRAVENOUS | Status: AC
  Administered 2014-02-16: 1000 mL via INTRAVENOUS

## 2014-02-16 MED ORDER — PROPOFOL 10 MG/ML IV EMUL
INTRAVENOUS | Status: AC
  Filled 2014-02-16: qty 40

## 2014-02-16 MED ORDER — ACETAMINOPHEN 160 MG/5ML PO SOLN: 325.0000 mg | ORAL | Status: DC | PRN

## 2014-02-16 MED ORDER — OXYCODONE-ACETAMINOPHEN 5-325 MG PO TABS
ORAL_TABLET | ORAL | Status: AC
  Filled 2014-02-16: qty 1

## 2014-02-16 MED ORDER — DEXAMETHASONE SODIUM PHOSPHATE 10 MG/ML IJ SOLN
INTRAMUSCULAR | Status: AC
  Filled 2014-02-16: qty 1

## 2014-02-16 MED ORDER — FENTANYL CITRATE 0.05 MG/ML IJ SOLN
INTRAMUSCULAR | Status: DC | PRN
  Administered 2014-02-16: 100 ug via INTRAVENOUS
  Administered 2014-02-16: 50 ug via INTRAVENOUS

## 2014-02-16 MED ORDER — LIDOCAINE HCL 2 % IJ SOLN
INTRAMUSCULAR | Status: AC
  Filled 2014-02-16: qty 20

## 2014-02-16 SURGICAL SUPPLY — 18 items
CATH ROBINSON RED A/P 16FR (CATHETERS) ×3 IMPLANT
CLOTH BEACON ORANGE TIMEOUT ST (SAFETY) ×3 IMPLANT
DECANTER SPIKE VIAL GLASS SM (MISCELLANEOUS) IMPLANT
GLOVE BIO SURGEON STRL SZ 6.5 (GLOVE) ×2 IMPLANT
GLOVE BIO SURGEONS STRL SZ 6.5 (GLOVE) ×1
GLOVE BIOGEL PI IND STRL 7.0 (GLOVE) ×2 IMPLANT
GLOVE BIOGEL PI IND STRL 7.5 (GLOVE) ×1 IMPLANT
GLOVE BIOGEL PI INDICATOR 7.0 (GLOVE) ×4
GLOVE BIOGEL PI INDICATOR 7.5 (GLOVE) ×2
GOWN STRL REUS W/TWL LRG LVL3 (GOWN DISPOSABLE) ×6 IMPLANT
NS IRRIG 1000ML POUR BTL (IV SOLUTION) ×3 IMPLANT
PACK VAGINAL MINOR WOMEN LF (CUSTOM PROCEDURE TRAY) ×3 IMPLANT
PAD OB MATERNITY 4.3X12.25 (PERSONAL CARE ITEMS) ×3 IMPLANT
PAD PREP 24X48 CUFFED NSTRL (MISCELLANEOUS) ×3 IMPLANT
SET BERKELEY SUCTION TUBING (SUCTIONS) ×3 IMPLANT
SYR 20CC LL (SYRINGE) ×3 IMPLANT
TOWEL OR 17X24 6PK STRL BLUE (TOWEL DISPOSABLE) ×6 IMPLANT
VACURETTE 7MM CVD STRL WRAP (CANNULA) ×3 IMPLANT

## 2014-02-16 NOTE — Discharge Instructions (Signed)
Miscarriage A miscarriage is the sudden loss of an unborn baby (fetus) before the 20th week of pregnancy. Most miscarriages happen in the first 3 months of pregnancy. Sometimes, it happens before a woman even knows she is pregnant. A miscarriage is also called a "spontaneous miscarriage" or "early pregnancy loss." Having a miscarriage can be an emotional experience. Talk with your caregiver about any questions you may have about miscarrying, the grieving process, and your future pregnancy plans. CAUSES   Problems with the fetal chromosomes that make it impossible for the baby to develop normally. Problems with the baby's genes or chromosomes are most often the result of errors that occur, by chance, as the embryo divides and grows. The problems are not inherited from the parents.  Infection of the cervix or uterus.   Hormone problems.   Problems with the cervix, such as having an incompetent cervix. This is when the tissue in the cervix is not strong enough to hold the pregnancy.   Problems with the uterus, such as an abnormally shaped uterus, uterine fibroids, or congenital abnormalities.   Certain medical conditions.   Smoking, drinking alcohol, or taking illegal drugs.   Trauma.  Often, the cause of a miscarriage is unknown.  SYMPTOMS   Vaginal bleeding or spotting, with or without cramps or pain.  Pain or cramping in the abdomen or lower back.  Passing fluid, tissue, or blood clots from the vagina. DIAGNOSIS  Your caregiver will perform a physical exam. You may also have an ultrasound to confirm the miscarriage. Blood or urine tests may also be ordered. TREATMENT   Sometimes, treatment is not necessary if you naturally pass all the fetal tissue that was in the uterus. If some of the fetus or placenta remains in the body (incomplete miscarriage), tissue left behind may become infected and must be removed. Usually, a dilation and curettage (D and C) procedure is performed.  During a D and C procedure, the cervix is widened (dilated) and any remaining fetal or placental tissue is gently removed from the uterus.  Antibiotic medicines are prescribed if there is an infection. Other medicines may be given to reduce the size of the uterus (contract) if there is a lot of bleeding.  If you have Rh negative blood and your baby was Rh positive, you will need a Rh immunoglobulin shot. This shot will protect any future baby from having Rh blood problems in future pregnancies. HOME CARE INSTRUCTIONS   Your caregiver may order bed rest or may allow you to continue light activity. Resume activity as directed by your caregiver.  Have someone help with home and family responsibilities during this time.   Keep track of the number of sanitary pads you use each day and how soaked (saturated) they are. Write down this information.   Do not use tampons. Do not douche or have sexual intercourse until approved by your caregiver.   Only take over-the-counter or prescription medicines for pain or discomfort as directed by your caregiver.   Do not take aspirin. Aspirin can cause bleeding.   Keep all follow-up appointments with your caregiver.   If you or your partner have problems with grieving, talk to your caregiver or seek counseling to help cope with the pregnancy loss. Allow enough time to grieve before trying to get pregnant again.  SEEK IMMEDIATE MEDICAL CARE IF:   You have severe cramps or pain in your back or abdomen.  You have a fever.  You pass large blood clots (walnut-sized   or larger) ortissue from your vagina. Save any tissue for your caregiver to inspect.   Your bleeding increases.   You have a thick, bad-smelling vaginal discharge.  You become lightheaded, weak, or you faint.   You have chills.  MAKE SURE YOU:  Understand these instructions.  Will watch your condition.  Will get help right away if you are not doing well or get  worse. Document Released: 09/10/2000 Document Revised: 07/12/2012 Document Reviewed: 05/06/2011 Exit[employer] Patient Information 2015 [employer]. This information is not intended to replace advice given to you by your health care provider. Make sure you discuss any questions you have with your health care provider.  

## 2014-02-16 NOTE — MAU Note (Signed)
Add on surgery for today.

## 2014-02-16 NOTE — H&P (Signed)
33 yo G4P2012 at 10 5/7 weeks presented after calling CNM with c/o mild cramping yesterday and today, with episode of moderate vaginal bleeding. Had US at 5 4/7 weeks, with SIUP, small SCH noted. On progesterone supplementation. Had normal QHCG and progesterone level in October.  Dx with 7 week SAB by US today.  Requests D&E for definitive treatment of loss.  Hx 2 previous C/S, B+ type.  Last ate at 0630, yogurt.  Patient Active Problem List   Diagnosis Date Noted  . Vaginal bleeding before [redacted] weeks gestation 02/16/2014  . History of endometriosis 07/21/2011  . Urolithiasis 07/21/2011  . Previous cesarean section x 2 06/28/2011  . Hx of pre-eclampsia in prior pregnancy, currently pregnant 06/28/2011  . Hashimoto's disease 06/28/2011  . Celiac disease 06/28/2011  . Asthma 06/28/2011  . H/O: depression 06/28/2011    Chief Complaint  Patient presents with  . Vaginal Bleeding  . Abdominal Pain   HPI: See above  OB History    Gravida Para Term Preterm AB TAB SAB Ectopic Multiple Living   4 2 2 0 1 0 1 0 1 2    1998--SAB 1st trimester 2011--Primary LTCS due to [facility], Dr. Haygood--pre-eclampsia 3013--Repeat LTCS, Dr. Haygood  Past Medical History  Diagnosis Date  . Celiac disease   . Hashimoto disease   . Asthma   . Urolithiasis   . Proteinuria 2010  . Stone, kidney   . Hyperthyroidism     Hashimoto's  . Headache(784.0)     migraines  . Depression     as a teen  . Complication of anesthesia     slow to wake up  . Anemia   . Infection     UTI  . Ovarian cyst   . Vaginal Pap smear, abnormal     f/u was fine  . Endometriosis     Past Surgical History  Procedure Laterality Date  . Laparoscopic endometriosis fulguration    . Tonsillectomy    . Nasal sinus surgery    . Cesarean section    . Cesarean  section  08/14/2011    Procedure: CESAREAN SECTION; Surgeon: Vanessa P Haygood, MD; Location: [facility]; Service: Gynecology; Laterality: N/A; Repeat    Family History  Problem Relation Age of Onset  . Anesthesia problems Neg Hx   . Hypotension Neg Hx   . Malignant hyperthermia Neg Hx   . Pseudochol deficiency Neg Hx   . Heart disease Father     cardiac aneurysm  . Hypertension Father   . Emphysema Father   . COPD Father   . Alcohol abuse Father   . Kidney disease Maternal Grandmother   . Mental illness Maternal Grandmother   . Depression Maternal Grandmother   . Diabetes Maternal Grandmother   . Heart disease Paternal Grandmother   . Alcohol abuse Brother   . Heart disease Paternal Grandfather     History  Substance Use Topics  . Smoking status: Former Smoker    Quit date: 02/28/2006  . Smokeless tobacco: Never Used  . Alcohol Use: 0.6 oz/week    1 Glasses of wine per week     Comment: Occ. wine; not with preg    Allergies:  Allergies  Allergen Reactions  . Fish Allergy Nausea And Vomiting  . Shrimp [Shellfish Allergy] Diarrhea  . Augmentin [Amoxicillin-Pot Clavulanate] Nausea And Vomiting  . Bextra [Valdecoxib] Nausea And Vomiting  . Gluten Other (See Comments)    Celiac disease  . Wheat Other (See Comments)      Patient states it makes her joints hurt and gives her migraines  . Morphine And Related Itching  . Sulfa Antibiotics Rash    Prescriptions prior to admission  Medication Sig Dispense Refill Last Dose  . albuterol (PROAIR HFA) 108 (90 BASE) MCG/ACT inhaler Inhale 1-2 puffs into the lungs every 6 (six) hours as needed for wheezing or shortness of breath.   rescue  . Levothyroxine Sodium (TIROSINT) 75 MCG CAPS Take 1 capsule by mouth daily before breakfast.   02/16/2014 at Unknown time  . Prenatal  Vit-Fe Fumarate-FA (PRENATAL MULTIVITAMIN) TABS tablet Take 1 tablet by mouth daily at 12 noon.   02/15/2014 at Unknown time  . progesterone 200 MG SUPP Place 200 mg vaginally at bedtime.   02/15/2014 at Unknown time  . acetaminophen (TYLENOL) 500 MG tablet Take 500-1,000 mg by mouth every 6 (six) hours as needed. For pain/headache   Not Taking  . Digestive Enzymes (PAPAYA ENZYME PO) Take by mouth 2 (two) times daily as needed.    Not Taking  . ferrous sulfate 325 (65 FE) MG tablet Take 1 tablet (325 mg total) by mouth daily with breakfast. (Patient not taking: Reported on 02/16/2014) 30 tablet 1 Taking  . prenatal vitamin w/FE, FA (PRENATAL 1 + 1) 27-1 MG TABS Take 1 tablet by mouth daily.    Taking  . progesterone (PROMETRIUM) 100 MG capsule Take 100 mg by mouth daily.   Not Taking  . sertraline (ZOLOFT) 50 MG tablet Take 1 tablet (50 mg total) by mouth daily. (Patient not taking: Reported on 02/16/2014) 30 tablet 5 Taking   Social:  Married, is a SAHM, college educated, Caucasian.  Denies etoh or tobacco use.   ROS: MIld cramping, vaginal bleeding Physical Exam   Blood pressure 103/64, pulse 74, temperature 98.5 F (36.9 C), temperature source Oral, resp. rate 16, height 5' 9" (1.753 m), weight 131 lb (59.421 kg), last menstrual period 12/03/2013, SpO2 100 %, not currently breastfeeding.  Physical Exam  In NAD Chest clear Heart RRR without murmur Abd soft, NT Pelvic--small amount dark blood in vault, cervix closed, NT, uterus approx 8 week size, retroverted. Unable to auscultate FHR. Ext WNL  ED Course  Assessment: 1st trimester bleeding B+ type  Plan: OB US for viability GC/chlamydia on urine   Tayden Nichelson CNM, MSN 02/16/2014 7:31 AM   Addendum: Preliminary US: 7 week IUFD, 2 complex cystic areas (2.4 x 1.4 x 2.3 and 2.1 x 1.1 x 1.5), small amount free fluid  Options reviewed with patient: Observation, Cytotech,  D&E. Patient advises she is scheduled to go out of town tomorrow, and again next week for Thanksgiving. Requests D&E later today, if possible. Dr. Kulwa consulted Will plan D&E this afternoon--scheduled to follow Dr. Kulwa's case.  D&E posted at 4:30p. Patient will be NPO until then, return to [facility] at 3p.   Lab Results Last 24 Hours    Results for orders placed or performed during the hospital encounter of 02/16/14 (from the past 24 hour(s))  Urinalysis, Routine w reflex microscopic Status: Abnormal   Collection Time: 02/16/14 5:54 AM  Result Value Ref Range   Color, Urine YELLOW YELLOW   APPearance CLEAR CLEAR   Specific Gravity, Urine 1.015 1.005 - 1.030   pH 6.5 5.0 - 8.0   Glucose, UA NEGATIVE NEGATIVE mg/dL   Hgb urine dipstick LARGE (A) NEGATIVE   Bilirubin Urine NEGATIVE NEGATIVE   Ketones, ur NEGATIVE NEGATIVE mg/dL   Protein, ur NEGATIVE NEGATIVE mg/dL     Urobilinogen, UA 0.2 0.0 - 1.0 mg/dL   Nitrite NEGATIVE NEGATIVE   Leukocytes, UA SMALL (A) NEGATIVE  Urine microscopic-add on Status: Abnormal   Collection Time: 02/16/14 5:54 AM  Result Value Ref Range   Squamous Epithelial / LPF MANY (A) RARE   WBC, UA 3-6 <3 WBC/hpf   RBC / HPF 21-50 <3 RBC/hpf   Bacteria, UA FEW (A) RARE  CBC Status: None   Collection Time: 02/16/14 9:05 AM  Result Value Ref Range   WBC 6.6 4.0 - 10.5 K/uL   RBC 4.36 3.87 - 5.11 MIL/uL   Hemoglobin 12.9 12.0 - 15.0 g/dL   HCT 38.2 36.0 - 46.0 %   MCV 87.6 78.0 - 100.0 fL   MCH 29.6 26.0 - 34.0 pg   MCHC 33.8 30.0 - 36.0 g/dL   RDW 14.2 11.5 - 15.5 %   Platelets 213 150 - 400 K/uL     QHCG pending.  Support to patient for loss. R&B of D&E reviewed with patient, including bleeding, infection, damage to other organs. Patient seems to understand these risks and wishes to proceed with D&E today as  scheduled.  Esker Dever, CNM 02/16/14 0940              

## 2014-02-16 NOTE — Anesthesia Preprocedure Evaluation (Signed)
Anesthesia Evaluation    Reviewed: Allergy & Precautions, H&P , NPO status , Patient's Chart, lab work & pertinent test results  Airway Mallampati: I  TM Distance: >3 FB Neck ROM: full    Dental no notable dental hx. (+) Teeth Intact   Pulmonary former smoker,    Pulmonary exam normal       Cardiovascular negative cardio ROS      Neuro/Psych    GI/Hepatic negative GI ROS, Neg liver ROS,   Endo/Other    Renal/GU      Musculoskeletal   Abdominal Normal abdominal exam  (+)   Peds  Hematology   Anesthesia Other Findings   Reproductive/Obstetrics negative OB ROS                             Anesthesia Physical Anesthesia Plan  ASA: II  Anesthesia Plan: MAC   Post-op Pain Management:    Induction: Intravenous  Airway Management Planned:   Additional Equipment:   Intra-op Plan:   Post-operative Plan:   Informed Consent: I have reviewed the patients History and Physical, chart, labs and discussed the procedure including the risks, benefits and alternatives for the proposed anesthesia with the patient or authorized representative who has indicated his/her understanding and acceptance.     Plan Discussed with: CRNA and Surgeon  Anesthesia Plan Comments:         Anesthesia Quick Evaluation

## 2014-02-16 NOTE — H&P (Signed)
33 yo [id number] at 10 5/7 weeks presented after calling CNM with c/o mild cramping yesterday and today, with episode of moderate vaginal bleeding. Had [place] at 5 4/7 weeks, with SIUP, small SCH noted. On progesterone supplementation. Had normal QHCG and progesterone level in October.  Dx with 7 week SAB by [place] today.  Requests D&E for definitive treatment of loss.  Hx 2 previous C/S, B+ type.  Last ate at 0630, yogurt.  Patient Active Problem List   Diagnosis Date Noted  . Vaginal bleeding before [redacted] weeks gestation 02/16/2014  . History of endometriosis 07/21/2011  . Urolithiasis 07/21/2011  . Previous cesarean section x 2 06/28/2011  . Hx of pre-eclampsia in prior pregnancy, currently pregnant 06/28/2011  . Hashimoto's disease 06/28/2011  . Celiac disease 06/28/2011  . Asthma 06/28/2011  . H/O: depression 06/28/2011    Chief Complaint  Patient presents with  . Vaginal Bleeding  . Abdominal Pain   HPI: See above  OB History    Gravida Para Term Preterm AB TAB SAB Ectopic Multiple Living   4 2 2  0 1 0 1 0 1 2    1998--SAB 1st trimester 2011--Primary LTCS due to [facility], Dr. Christie Nottingham 3013--Repeat LTCS, Dr. Pennie Rushing  Past Medical History  Diagnosis Date  . Celiac disease   . Hashimoto disease   . Asthma   . Urolithiasis   . Proteinuria 2010  . Stone, kidney   . Hyperthyroidism     Hashimoto's  . Headache(784.0)     migraines  . Depression     as a teen  . Complication of anesthesia     slow to wake up  . Anemia   . Infection     UTI  . Ovarian cyst   . Vaginal Pap smear, abnormal     f/u was fine  . Endometriosis     Past Surgical History  Procedure Laterality Date  . Laparoscopic endometriosis fulguration    . Tonsillectomy    . Nasal sinus surgery    . Cesarean section    . Cesarean  section  08/14/2011    Procedure: CESAREAN SECTION; Surgeon: Hal Morales, MD; Location: [facility]; Service: Gynecology; Laterality: N/A; Repeat    Family History  Problem Relation Age of Onset  . Anesthesia problems Neg Hx   . Hypotension Neg Hx   . Malignant hyperthermia Neg Hx   . Pseudochol deficiency Neg Hx   . Heart disease Father     cardiac aneurysm  . Hypertension Father   . Emphysema Father   . COPD Father   . Alcohol abuse Father   . Kidney disease Maternal Grandmother   . Mental illness Maternal Grandmother   . Depression Maternal Grandmother   . Diabetes Maternal Grandmother   . Heart disease Paternal Grandmother   . Alcohol abuse Brother   . Heart disease Paternal Grandfather     History  Substance Use Topics  . Smoking status: Former Smoker    Quit date: 02/28/2006  . Smokeless tobacco: Never Used  . Alcohol Use: 0.6 oz/week    1 Glasses of wine per week     Comment: Occ. wine; not with preg    Allergies:  Allergies  Allergen Reactions  . Fish Allergy Nausea And Vomiting  . Shrimp [Shellfish Allergy] Diarrhea  . Augmentin [Amoxicillin-Pot Clavulanate] Nausea And Vomiting  . Bextra [Valdecoxib] Nausea And Vomiting  . Gluten Other (See Comments)    Celiac disease  . Wheat Other (See Comments)  Patient states it makes her joints hurt and gives her migraines  . Morphine And Related Itching  . Sulfa Antibiotics Rash    Prescriptions prior to admission  Medication Sig Dispense Refill Last Dose  . albuterol (PROAIR HFA) 108 (90 BASE) MCG/ACT inhaler Inhale 1-2 puffs into the lungs every 6 (six) hours as needed for wheezing or shortness of breath.   rescue  . Levothyroxine Sodium (TIROSINT) 75 MCG CAPS Take 1 capsule by mouth daily before breakfast.   02/16/2014 at Unknown time  . Prenatal  Vit-Fe Fumarate-FA (PRENATAL MULTIVITAMIN) TABS tablet Take 1 tablet by mouth daily at 12 noon.   02/15/2014 at Unknown time  . progesterone 200 MG SUPP Place 200 mg vaginally at bedtime.   02/15/2014 at Unknown time  . acetaminophen (TYLENOL) 500 MG tablet Take 500-1,000 mg by mouth every 6 (six) hours as needed. For pain/headache   Not Taking  . Digestive Enzymes (PAPAYA ENZYME PO) Take by mouth 2 (two) times daily as needed.    Not Taking  . ferrous sulfate 325 (65 FE) MG tablet Take 1 tablet (325 mg total) by mouth daily with breakfast. (Patient not taking: Reported on 02/16/2014) 30 tablet 1 Taking  . prenatal vitamin w/FE, FA (PRENATAL 1 + 1) 27-1 MG TABS Take 1 tablet by mouth daily.    Taking  . progesterone (PROMETRIUM) 100 MG capsule Take 100 mg by mouth daily.   Not Taking  . sertraline (ZOLOFT) 50 MG tablet Take 1 tablet (50 mg total) by mouth daily. (Patient not taking: Reported on 02/16/2014) 30 tablet 5 Taking   Social:  Married, is a Games developer, college educated, Caucasian.  Denies etoh or tobacco use.   ROS: MIld cramping, vaginal bleeding Physical Exam   Blood pressure 103/64, pulse 74, temperature 98.5 F (36.9 C), temperature source Oral, resp. rate 16, height 5\' 9"  (1.753 m), weight 131 lb (59.421 kg), last menstrual period 12/03/2013, SpO2 100 %, not currently breastfeeding.  Physical Exam  In NAD Chest clear Heart RRR without murmur Abd soft, NT Pelvic--small amount dark blood in vault, cervix closed, NT, uterus approx 8 week size, retroverted. Unable to auscultate FHR. Ext WNL  ED Course  Assessment: 1st trimester bleeding B+ type  Plan: OB US for viability GC/chlamydia on urine   Nigel Bridgeman CNM, MSN 02/16/2014 7:31 AM   Addendum: Preliminary [place]: 7 week IUFD, 2 complex cystic areas (2.4 x 1.4 x 2.3 and 2.1 x 1.1 x 1.5), small amount free fluid  Options reviewed with patient: Observation, Cytotech,  D&E. Patient advises she is scheduled to go out of town tomorrow, and again next week for Thanksgiving. Requests D&E later today, if possible. Dr. Sallye Ober consulted Will plan D&E this afternoon--scheduled to follow Dr. Irineo Axon case.  D&E posted at 4:30p. Patient will be NPO until then, return to [facility] at 3p.   Lab Results Last 24 Hours    Results for orders placed or performed during the hospital encounter of 02/16/14 (from the past 24 hour(s))  Urinalysis, Routine w reflex microscopic Status: Abnormal   Collection Time: 02/16/14 5:54 AM  Result Value Ref Range   Color, Urine YELLOW YELLOW   APPearance CLEAR CLEAR   Specific Gravity, Urine 1.015 1.005 - 1.030   pH 6.5 5.0 - 8.0   Glucose, UA NEGATIVE NEGATIVE mg/dL   Hgb urine dipstick LARGE (A) NEGATIVE   Bilirubin Urine NEGATIVE NEGATIVE   Ketones, ur NEGATIVE NEGATIVE mg/dL   Protein, ur NEGATIVE NEGATIVE mg/dL  Urobilinogen, UA 0.2 0.0 - 1.0 mg/dL   Nitrite NEGATIVE NEGATIVE   Leukocytes, UA SMALL (A) NEGATIVE  Urine microscopic-add on Status: Abnormal   Collection Time: 02/16/14 5:54 AM  Result Value Ref Range   Squamous Epithelial / LPF MANY (A) RARE   WBC, UA 3-6 <3 WBC/hpf   RBC / HPF 21-50 <3 RBC/hpf   Bacteria, UA FEW (A) RARE  CBC Status: None   Collection Time: 02/16/14 9:05 AM  Result Value Ref Range   WBC 6.6 4.0 - 10.5 K/uL   RBC 4.36 3.87 - 5.11 MIL/uL   Hemoglobin 12.9 12.0 - 15.0 g/dL   HCT 16.1 09.6 - 04.5 %   MCV 87.6 78.0 - 100.0 fL   MCH 29.6 26.0 - 34.0 pg   MCHC 33.8 30.0 - 36.0 g/dL   RDW 40.9 81.1 - 91.4 %   Platelets 213 150 - 400 K/uL     QHCG pending.  Support to patient for loss. R&B of D&E reviewed with patient, including bleeding, infection, damage to other organs. Patient seems to understand these risks and wishes to proceed with D&E today as  scheduled.  Nigel Bridgeman, CNM 02/16/14 [id number]

## 2014-02-16 NOTE — MAU Note (Addendum)
Increased cramping and bleeding.  ? Subchorionic hemorrhage on [place]

## 2014-02-16 NOTE — Discharge Instructions (Signed)

## 2014-02-16 NOTE — MAU Provider Note (Signed)
History   33 yo [id number] at 10 5/7 weeks presented after calling CNM with c/o mild cramping yesterday and today, with episode of moderate vaginal bleeding.  Had [place] at 5 4/7 weeks, with SIUP, small SCH noted.  On progesterone supplementation.  Had normal QHCG and progesterone level in October.  Hx 2 previous C/S, B+ type.  Last ate at 0630, yogurt.  Patient Active Problem List   Diagnosis Date Noted  . Vaginal bleeding before [redacted] weeks gestation 02/16/2014  . History of endometriosis 07/21/2011  . Urolithiasis 07/21/2011  . Previous cesarean section x 2 06/28/2011  . Hx of pre-eclampsia in prior pregnancy, currently pregnant 06/28/2011  . Hashimoto's disease 06/28/2011  . Celiac disease 06/28/2011  . Asthma 06/28/2011  . H/O: depression 06/28/2011    Chief Complaint  Patient presents with  . Vaginal Bleeding  . Abdominal Pain   HPI: See above  OB History    Gravida Para Term Preterm AB TAB SAB Ectopic Multiple Living   4 2 2  0 1 0 1 0 1 2      Past Medical History  Diagnosis Date  . Celiac disease   . Hashimoto disease   . Asthma   . Urolithiasis   . Proteinuria 2010  . Stone, kidney   . Hyperthyroidism     Hashimoto's  . Headache(784.0)     migraines  . Depression     as a teen  . Complication of anesthesia     slow to wake up  . Anemia   . Infection     UTI  . Ovarian cyst   . Vaginal Pap smear, abnormal     f/u was fine  . Endometriosis     Past Surgical History  Procedure Laterality Date  . Laparoscopic endometriosis fulguration    . Tonsillectomy    . Nasal sinus surgery    . Cesarean section    . Cesarean section  08/14/2011    Procedure: CESAREAN SECTION;  Surgeon: Hal Morales, MD;  Location: [facility];  Service: Gynecology;  Laterality: N/A;  Repeat    Family History  Problem Relation Age of Onset  . Anesthesia problems Neg Hx   . Hypotension Neg Hx   . Malignant hyperthermia Neg Hx   . Pseudochol deficiency Neg Hx   . Heart disease  Father     cardiac aneurysm  . Hypertension Father   . Emphysema Father   . COPD Father   . Alcohol abuse Father   . Kidney disease Maternal Grandmother   . Mental illness Maternal Grandmother   . Depression Maternal Grandmother   . Diabetes Maternal Grandmother   . Heart disease Paternal Grandmother   . Alcohol abuse Brother   . Heart disease Paternal Grandfather     History  Substance Use Topics  . Smoking status: Former Smoker    Quit date: 02/28/2006  . Smokeless tobacco: Never Used  . Alcohol Use: 0.6 oz/week    1 Glasses of wine per week     Comment: Occ. wine; not with preg    Allergies:  Allergies  Allergen Reactions  . Fish Allergy Nausea And Vomiting  . Shrimp [Shellfish Allergy] Diarrhea  . Augmentin [Amoxicillin-Pot Clavulanate] Nausea And Vomiting  . Bextra [Valdecoxib] Nausea And Vomiting  . Gluten Other (See Comments)    Celiac disease  . Wheat Other (See Comments)    Patient states it makes her joints hurt and gives her migraines  . Morphine And Related Itching  .  Sulfa Antibiotics Rash    Prescriptions prior to admission  Medication Sig Dispense Refill Last Dose  . albuterol (PROAIR HFA) 108 (90 BASE) MCG/ACT inhaler Inhale 1-2 puffs into the lungs every 6 (six) hours as needed for wheezing or shortness of breath.   rescue  . Levothyroxine Sodium (TIROSINT) 75 MCG CAPS Take 1 capsule by mouth daily before breakfast.   02/16/2014 at Unknown time  . Prenatal Vit-Fe Fumarate-FA (PRENATAL MULTIVITAMIN) TABS tablet Take 1 tablet by mouth daily at 12 noon.   02/15/2014 at Unknown time  . progesterone 200 MG SUPP Place 200 mg vaginally at bedtime.   02/15/2014 at Unknown time  . acetaminophen (TYLENOL) 500 MG tablet Take 500-1,000 mg by mouth every 6 (six) hours as needed. For pain/headache   Not Taking  . Digestive Enzymes (PAPAYA ENZYME PO) Take by mouth 2 (two) times daily as needed.    Not Taking  . ferrous sulfate 325 (65 FE) MG tablet Take 1 tablet (325  mg total) by mouth daily with breakfast. (Patient not taking: Reported on 02/16/2014) 30 tablet 1 Taking  . prenatal vitamin w/FE, FA (PRENATAL 1 + 1) 27-1 MG TABS Take 1 tablet by mouth daily.     Taking  . progesterone (PROMETRIUM) 100 MG capsule Take 100 mg by mouth daily.   Not Taking  . sertraline (ZOLOFT) 50 MG tablet Take 1 tablet (50 mg total) by mouth daily. (Patient not taking: Reported on 02/16/2014) 30 tablet 5 Taking    ROS:  MIld cramping, vaginal bleeding Physical Exam   Blood pressure 103/64, pulse 74, temperature 98.5 F (36.9 C), temperature source Oral, resp. rate 16, height 5\' 9"  (1.753 m), weight 131 lb (59.421 kg), last menstrual period 12/03/2013, SpO2 100 %, not currently breastfeeding.  Physical Exam  In NAD Chest clear Heart RRR without murmur Abd soft, NT Pelvic--small amount dark blood in vault, cervix closed, NT, uterus approx 8 week size, retroverted.  Unable to auscultate FHR. Ext WNL  ED Course  Assessment: 1st trimester bleeding B+ type  Plan: OB US for viability GC/chlamydia on urine   Nigel BridgemanLATHAM, Mitcheal Sweetin CNM, MSN 02/16/2014 7:31 AM   Addendum: Preliminary US:  7 week IUFD, 2 complex cystic areas (2.4 x 1.4 x 2.3 and 2.1 x 1.1 x 1.5), small amount free fluid  Options reviewed with patient:  Observation, Cytotech, D&E. Patient advises she is scheduled to go out of town tomorrow, and again next week for Thanksgiving. Requests D&E later today, if possible. Dr. Sallye OberKulwa consulted Will plan D&E this afternoon--scheduled to follow Dr. Irineo AxonKulwa's case, approx at 3:30p. Patient will be NPO until then, [facility].  Results for orders placed or performed during the hospital encounter of 02/16/14 (from the past 24 hour(s))  Urinalysis, Routine w reflex microscopic     Status: Abnormal   Collection Time: 02/16/14  5:54 AM  Result Value Ref Range   Color, Urine YELLOW YELLOW   APPearance CLEAR CLEAR   Specific Gravity, Urine 1.015 1.005 - 1.030   pH  6.5 5.0 - 8.0   Glucose, UA NEGATIVE NEGATIVE mg/dL   Hgb urine dipstick LARGE (A) NEGATIVE   Bilirubin Urine NEGATIVE NEGATIVE   Ketones, ur NEGATIVE NEGATIVE mg/dL   Protein, ur NEGATIVE NEGATIVE mg/dL   Urobilinogen, UA 0.2 0.0 - 1.0 mg/dL   Nitrite NEGATIVE NEGATIVE   Leukocytes, UA SMALL (A) NEGATIVE  Urine microscopic-add on     Status: Abnormal   Collection Time: 02/16/14  5:54  AM  Result Value Ref Range   Squamous Epithelial / LPF MANY (A) RARE   WBC, UA 3-6 <3 WBC/hpf   RBC / HPF 21-50 <3 RBC/hpf   Bacteria, UA FEW (A) RARE  CBC     Status: None   Collection Time: 02/16/14  9:05 AM  Result Value Ref Range   WBC 6.6 4.0 - 10.5 K/uL   RBC 4.36 3.87 - 5.11 MIL/uL   Hemoglobin 12.9 12.0 - 15.0 g/dL   HCT 78.238.2 95.636.0 - 21.346.0 %   MCV 87.6 78.0 - 100.0 fL   MCH 29.6 26.0 - 34.0 pg   MCHC 33.8 30.0 - 36.0 g/dL   RDW 08.614.2 57.811.5 - 46.915.5 %   Platelets 213 150 - 400 K/uL   QHCG pending.  Support to patient for loss. R&B of D&E reviewed with patient, including bleeding, infection, damage to other organs. Patient seems to understand these risks and wishes to proceed with D&E today as scheduled.  Nigel BridgemanVicki Young Brim, CNM 02/16/14 [id number]

## 2014-02-16 NOTE — MAU Note (Signed)
Pt reports she began having cramping yesterday and then this am she started having vaginal bleeding.

## 2014-02-16 NOTE — Transfer of Care (Signed)
Immediate Anesthesia Transfer of Care Note  Patient: Alexandria Schneider  Procedure(s) Performed: Procedure(s): DILATATION AND EVACUATION (N/A)  Patient Location: PACU  Anesthesia Type:MAC  Level of Consciousness: awake, alert  and oriented  Airway & Oxygen Therapy: Patient Spontanous Breathing  Post-op Assessment: Report given to PACU RN and Post -op Vital signs reviewed and stable  Post vital signs: Reviewed and stable  Complications: No apparent anesthesia complications

## 2014-02-16 NOTE — Op Note (Signed)
Patient's: Alexandria Schneider, Alexandria Schneider  Date of birth: 1980/09/26  Date of procedure: 02/16/2014  Preop diagnosis: Missed abortion at [redacted] weeks EGA.  Postop diagnosis: Same as above.  Surgeon: Dr. Hoover BrownsEMA Malique Driskill.  Assistants: Field seismologistcrub technician, Maurine Ministerennis.  Anesthesia: MAC  Complications: None  Input: 100cc LR  Output: EBL: 100cc.  Urine: 50cc.   Findings: 7 week size anteverted uterus. No palpable adnexal masses bilaterally. Cervix closed with little blood coming through the os.  Indications: 33 year-old G4 para 2012 who was supposed to be 10 weeks and 5 days by LMP who presented with vaginal bleeding and abdominal cramping. An ultrasound was done that showed a missed abortion at [redacted] weeks EGA, without a fetal heartbeat The patient's desired surgical management of the missed abortion. We discussed risks benefits and alternatives of the procedure. She is known blood group B positive  Procedure:  Informed consent was obtained from the patient to undergo the procedure. She was taken to the operating room anesthesia was administered without difficulty. Doxycycline 100 mg IV was given preoperatively.  She was placed in the dorsal lithotomy position and prepped and draped in the usual sterile fashion. Straight catheterization was performed.  A Weighted speculum and anterior retractor were used to view the cervix. The Allis clamp was used to hold the cervix in place.  The cervix was dilated serially with the [facility] dilators up to #23 dilator. The suction curette #7 curved was then advanced and the uterus cleared of the products of conception with 3 passes of the suction curette.  Gentle sharp curettage was then performed and the 4 quadrants were noted to be gritty.  The suction curette was once again passed into the uterus to clear it of the remaining products of conception. There was minimal bleeding at the end of this procedure. The instruments were then removed and the patient was then awoken from anesthesia she was  cleaned and taken to recovery room in stable condition  Specimen: Products of conception.

## 2014-02-16 NOTE — Anesthesia Postprocedure Evaluation (Signed)
Anesthesia Post Note  Patient: Alexandria Schneider  Procedure(s) Performed: Procedure(s) (LRB): DILATATION AND EVACUATION (N/A)  Anesthesia type: MAC  Patient location: PACU  Post pain: Pain level controlled  Post assessment: Post-op Vital signs reviewed  Last Vitals:  Filed Vitals:   02/16/14 1507  BP: 97/81  Pulse: 89  Temp: 36.8 C  Resp: 18    Post vital signs: Reviewed  Level of consciousness: sedated  Complications: No apparent anesthesia complications

## 2014-02-16 NOTE — Brief Op Note (Signed)
02/16/2014  6:24 PM  PATIENT:  Richrd HumblesJayn Figge  33 y.o. female  PRE-OPERATIVE DIAGNOSIS:  Missed abortion at [redacted] weeks EGA.  POST-OPERATIVE DIAGNOSIS:  Missed abortion at [redacted] weeks EGA.  PROCEDURE:  Procedure(s): DILATATION AND EVACUATION (N/A)  SURGEON:  Surgeon(s) and Role:    * Saysha Menta Alger SimonsWakuru Kevyn Wengert, MD - Primary  PHYSICIAN ASSISTANT:   ASSISTANTS: Scrub techinician.    ANESTHESIA:   MAC  EBL:  Total I/O In: 1000 [I.V.:1000] Out: 150 [Urine:50; Blood:100]  BLOOD ADMINISTERED:none  DRAINS: none   LOCAL MEDICATIONS USED:  NONE  SPECIMEN:  Source of Specimen:  Products of conception.   DISPOSITION OF SPECIMEN:  PATHOLOGY  COUNTS:  YES  TOURNIQUET:  * No tourniquets in log *  DICTATION: .Note written in EPIC  PLAN OF CARE: Discharge to home after PACU  PATIENT DISPOSITION:  PACU - hemodynamically stable.   Delay start of Pharmacological VTE agent (>24hrs) due to surgical blood loss or risk of bleeding: not applicable

## 2014-02-17 ENCOUNTER — Encounter (HOSPITAL_COMMUNITY): Payer: Self-pay | Admitting: Obstetrics & Gynecology

## 2014-02-17 LAB — GC/CHLAMYDIA PROBE AMP
CT Probe RNA: NEGATIVE
GC PROBE AMP APTIMA: NEGATIVE

## 2014-02-19 NOTE — Discharge Summary (Signed)
Patient: Alexandria Schneider, [name]  Date of birth: October 02, 1980.    Admission date: 02/16/14.  Discharge date:02/16/14.  Admission diagnosis: Missed abortion at [redacted] weeks EGA.  Discharge diagnosis: Same as above.  History of present illness/hospital course  This patient presented complaining of vaginal bleeding and abdominal cramps. She had an ultrasound that showed a missed abortion at [redacted] weeks EGA. She was counseled on the management options and she desired surgical management by suction dilation and curettage. This was performed without any complications she was then discharged to home on the day of surgery.  Procedures: Suction dilation and curettage  Significant labs: Blood group Rh+  Discharge medication: Doxycycline, Percocet, ibuprofen  Discharge instructions: She was advised not to have anything in the vagina for the next 2 weeks that affects no tampons no douching. She was advised to call if with fevers or excessive pain or excessive bleeding  Follow-up:   2 weeks at the office for postop check  Disposition: To home  Condition: Stable

## 2014-09-11 ENCOUNTER — Other Ambulatory Visit: Payer: Self-pay | Admitting: Internal Medicine

## 2014-09-11 DIAGNOSIS — E065 Other chronic thyroiditis: Secondary | ICD-10-CM

## 2014-11-02 ENCOUNTER — Ambulatory Visit
Admission: RE | Admit: 2014-11-02 | Discharge: 2014-11-02 | Disposition: A | Discharge status: Home / Self Care | Payer: BLUE CROSS/BLUE SHIELD | Source: Ambulatory Visit | Attending: Internal Medicine | Admitting: Internal Medicine

## 2014-11-02 DIAGNOSIS — E065 Other chronic thyroiditis: Secondary | ICD-10-CM

## 2014-12-22 ENCOUNTER — Encounter (HOSPITAL_COMMUNITY): Payer: Self-pay | Admitting: *Deleted

## 2015-08-21 ENCOUNTER — Ambulatory Visit (HOSPITAL_COMMUNITY): Payer: BLUE CROSS/BLUE SHIELD | Admitting: Anesthesiology

## 2015-08-21 ENCOUNTER — Encounter (HOSPITAL_COMMUNITY)
Admission: RE | Disposition: A | Discharge status: Home / Self Care | Payer: Self-pay | Source: Ambulatory Visit | Attending: Orthopaedic Surgery

## 2015-08-21 ENCOUNTER — Other Ambulatory Visit: Payer: Self-pay | Admitting: Physician Assistant

## 2015-08-21 ENCOUNTER — Ambulatory Visit (HOSPITAL_COMMUNITY): Payer: BLUE CROSS/BLUE SHIELD

## 2015-08-21 ENCOUNTER — Encounter (HOSPITAL_COMMUNITY): Payer: Self-pay | Admitting: General Practice

## 2015-08-21 ENCOUNTER — Observation Stay (HOSPITAL_COMMUNITY)
Admission: RE | Admit: 2015-08-21 | Discharge: 2015-08-22 | Disposition: A | Discharge status: Home / Self Care | Payer: BLUE CROSS/BLUE SHIELD | Source: Ambulatory Visit | Attending: Orthopaedic Surgery | Admitting: Orthopaedic Surgery

## 2015-08-21 DIAGNOSIS — W010XXA Fall on same level from slipping, tripping and stumbling without subsequent striking against object, initial encounter: Secondary | ICD-10-CM | POA: Diagnosis not present

## 2015-08-21 DIAGNOSIS — Z880 Allergy status to penicillin: Secondary | ICD-10-CM | POA: Diagnosis not present

## 2015-08-21 DIAGNOSIS — S42023A Displaced fracture of shaft of unspecified clavicle, initial encounter for closed fracture: Secondary | ICD-10-CM | POA: Diagnosis present

## 2015-08-21 DIAGNOSIS — Z87891 Personal history of nicotine dependence: Secondary | ICD-10-CM | POA: Insufficient documentation

## 2015-08-21 DIAGNOSIS — Y9389 Activity, other specified: Secondary | ICD-10-CM | POA: Diagnosis not present

## 2015-08-21 DIAGNOSIS — S42009A Fracture of unspecified part of unspecified clavicle, initial encounter for closed fracture: Secondary | ICD-10-CM

## 2015-08-21 DIAGNOSIS — E0590 Thyrotoxicosis, unspecified without thyrotoxic crisis or storm: Secondary | ICD-10-CM | POA: Insufficient documentation

## 2015-08-21 DIAGNOSIS — S42022A Displaced fracture of shaft of left clavicle, initial encounter for closed fracture: Secondary | ICD-10-CM | POA: Diagnosis present

## 2015-08-21 DIAGNOSIS — Z419 Encounter for procedure for purposes other than remedying health state, unspecified: Secondary | ICD-10-CM

## 2015-08-21 DIAGNOSIS — J45909 Unspecified asthma, uncomplicated: Secondary | ICD-10-CM | POA: Diagnosis not present

## 2015-08-21 DIAGNOSIS — F329 Major depressive disorder, single episode, unspecified: Secondary | ICD-10-CM | POA: Diagnosis not present

## 2015-08-21 HISTORY — DX: Other specified postprocedural states: Z98.890

## 2015-08-21 HISTORY — DX: Other specified postprocedural states: R11.2

## 2015-08-21 HISTORY — PX: ORIF CLAVICULAR FRACTURE: SHX5055

## 2015-08-21 LAB — CBC
HCT: 37.3 % (ref 36.0–46.0)
Hemoglobin: 11.8 g/dL — ABNORMAL LOW (ref 12.0–15.0)
MCH: 27.2 pg (ref 26.0–34.0)
MCHC: 31.6 g/dL (ref 30.0–36.0)
MCV: 85.9 fL (ref 78.0–100.0)
PLATELETS: 222 10*3/uL (ref 150–400)
RBC: 4.34 MIL/uL (ref 3.87–5.11)
RDW: 13.7 % (ref 11.5–15.5)
WBC: 5 10*3/uL (ref 4.0–10.5)

## 2015-08-21 LAB — HCG, SERUM, QUALITATIVE: Preg, Serum: NEGATIVE

## 2015-08-21 SURGERY — OPEN REDUCTION INTERNAL FIXATION (ORIF) CLAVICULAR FRACTURE
Anesthesia: Regional | Laterality: Left

## 2015-08-21 MED ORDER — MIDAZOLAM HCL 5 MG/5ML IJ SOLN
INTRAMUSCULAR | Status: DC | PRN
  Administered 2015-08-21: 2 mg via INTRAVENOUS

## 2015-08-21 MED ORDER — SODIUM CHLORIDE 0.9 % IV SOLN
INTRAVENOUS | Status: DC
  Administered 2015-08-21: 22:00:00 via INTRAVENOUS

## 2015-08-21 MED ORDER — MIDAZOLAM HCL 2 MG/2ML IJ SOLN
INTRAMUSCULAR | Status: AC
  Administered 2015-08-21: 2 mg via INTRAVENOUS
  Filled 2015-08-21: qty 2

## 2015-08-21 MED ORDER — LACTATED RINGERS IV SOLN
INTRAVENOUS | Status: DC | PRN
  Administered 2015-08-21: 17:00:00 via INTRAVENOUS

## 2015-08-21 MED ORDER — PROPOFOL 10 MG/ML IV BOLUS
INTRAVENOUS | Status: DC | PRN
  Administered 2015-08-21: 100 mg via INTRAVENOUS

## 2015-08-21 MED ORDER — METOCLOPRAMIDE HCL 5 MG PO TABS: 5.0000 mg | ORAL_TABLET | Freq: Three times a day (TID) | ORAL | Status: DC | PRN

## 2015-08-21 MED ORDER — ZOLPIDEM TARTRATE 5 MG PO TABS: 5.0000 mg | ORAL_TABLET | Freq: Every evening | ORAL | Status: DC | PRN

## 2015-08-21 MED ORDER — PROCHLORPERAZINE EDISYLATE 5 MG/ML IJ SOLN
10.0000 mg | INTRAMUSCULAR | Status: DC | PRN
  Administered 2015-08-21: 10 mg via INTRAVENOUS

## 2015-08-21 MED ORDER — OXYCODONE HCL 5 MG PO TABS
5.0000 mg | ORAL_TABLET | ORAL | Status: DC | PRN
  Administered 2015-08-22 (×2): 10 mg via ORAL
  Filled 2015-08-21 (×2): qty 2

## 2015-08-21 MED ORDER — BUPIVACAINE-EPINEPHRINE (PF) 0.5% -1:200000 IJ SOLN
INTRAMUSCULAR | Status: DC | PRN
  Administered 2015-08-21: 20 mL via PERINEURAL

## 2015-08-21 MED ORDER — CLINDAMYCIN PHOSPHATE 600 MG/50ML IV SOLN
600.0000 mg | Freq: Four times a day (QID) | INTRAVENOUS | Status: AC
  Administered 2015-08-21 – 2015-08-22 (×3): 600 mg via INTRAVENOUS
  Filled 2015-08-21 (×3): qty 50

## 2015-08-21 MED ORDER — ROCURONIUM BROMIDE 100 MG/10ML IV SOLN
INTRAVENOUS | Status: DC | PRN
  Administered 2015-08-21: 30 mg via INTRAVENOUS

## 2015-08-21 MED ORDER — HYDROMORPHONE HCL 1 MG/ML IJ SOLN: 1.0000 mg | INTRAMUSCULAR | Status: DC | PRN

## 2015-08-21 MED ORDER — AMPHETAMINE-DEXTROAMPHET ER 10 MG PO CP24
30.0000 mg | ORAL_CAPSULE | Freq: Every day | ORAL | Status: DC
  Filled 2015-08-21: qty 3

## 2015-08-21 MED ORDER — HYDROMORPHONE HCL 1 MG/ML IJ SOLN: 0.2500 mg | INTRAMUSCULAR | Status: DC | PRN

## 2015-08-21 MED ORDER — PHENYLEPHRINE HCL 10 MG/ML IJ SOLN
INTRAMUSCULAR | Status: DC | PRN
  Administered 2015-08-21 (×4): 80 ug via INTRAVENOUS

## 2015-08-21 MED ORDER — LACTATED RINGERS IV SOLN
INTRAVENOUS | Status: DC
  Administered 2015-08-21: 15:00:00 via INTRAVENOUS

## 2015-08-21 MED ORDER — PROCHLORPERAZINE EDISYLATE 5 MG/ML IJ SOLN
INTRAMUSCULAR | Status: AC
  Filled 2015-08-21: qty 2

## 2015-08-21 MED ORDER — DIPHENHYDRAMINE HCL 12.5 MG/5ML PO ELIX: 12.5000 mg | ORAL_SOLUTION | ORAL | Status: DC | PRN

## 2015-08-21 MED ORDER — METOCLOPRAMIDE HCL 5 MG/ML IJ SOLN
INTRAMUSCULAR | Status: DC | PRN
  Administered 2015-08-21: 10 mg via INTRAVENOUS

## 2015-08-21 MED ORDER — METHOCARBAMOL 1000 MG/10ML IJ SOLN
500.0000 mg | Freq: Four times a day (QID) | INTRAMUSCULAR | Status: DC | PRN
  Filled 2015-08-21: qty 5

## 2015-08-21 MED ORDER — 0.9 % SODIUM CHLORIDE (POUR BTL) OPTIME
TOPICAL | Status: DC | PRN
  Administered 2015-08-21: 1000 mL

## 2015-08-21 MED ORDER — LEVOTHYROXINE SODIUM 100 MCG PO TABS: 100.0000 ug | ORAL_TABLET | Freq: Every day | ORAL | Status: DC

## 2015-08-21 MED ORDER — BUPIVACAINE HCL (PF) 0.25 % IJ SOLN
INTRAMUSCULAR | Status: AC
  Filled 2015-08-21: qty 30

## 2015-08-21 MED ORDER — MIDAZOLAM HCL 2 MG/2ML IJ SOLN
INTRAMUSCULAR | Status: AC
  Filled 2015-08-21: qty 2

## 2015-08-21 MED ORDER — FENTANYL CITRATE (PF) 100 MCG/2ML IJ SOLN
INTRAMUSCULAR | Status: AC
  Administered 2015-08-21: 100 ug via INTRAVENOUS
  Filled 2015-08-21: qty 2

## 2015-08-21 MED ORDER — ACETAMINOPHEN 650 MG RE SUPP: 650.0000 mg | Freq: Four times a day (QID) | RECTAL | Status: DC | PRN

## 2015-08-21 MED ORDER — GLYCOPYRROLATE 0.2 MG/ML IJ SOLN
INTRAMUSCULAR | Status: DC | PRN
  Administered 2015-08-21: 0.4 mg via INTRAVENOUS

## 2015-08-21 MED ORDER — CLINDAMYCIN PHOSPHATE 900 MG/50ML IV SOLN
INTRAVENOUS | Status: AC
  Filled 2015-08-21: qty 50

## 2015-08-21 MED ORDER — CHLORHEXIDINE GLUCONATE 4 % EX LIQD: 60.0000 mL | Freq: Once | CUTANEOUS | Status: DC

## 2015-08-21 MED ORDER — FENTANYL CITRATE (PF) 250 MCG/5ML IJ SOLN
INTRAMUSCULAR | Status: AC
  Filled 2015-08-21: qty 5

## 2015-08-21 MED ORDER — NEOSTIGMINE METHYLSULFATE 10 MG/10ML IV SOLN
INTRAVENOUS | Status: DC | PRN
  Administered 2015-08-21: 3 mg via INTRAVENOUS

## 2015-08-21 MED ORDER — LIDOCAINE HCL (CARDIAC) 20 MG/ML IV SOLN
INTRAVENOUS | Status: DC | PRN
  Administered 2015-08-21: 60 mg via INTRAVENOUS

## 2015-08-21 MED ORDER — METHOCARBAMOL 500 MG PO TABS
500.0000 mg | ORAL_TABLET | Freq: Four times a day (QID) | ORAL | Status: DC | PRN
  Administered 2015-08-22 (×2): 500 mg via ORAL
  Filled 2015-08-21 (×2): qty 1

## 2015-08-21 MED ORDER — PROPOFOL 10 MG/ML IV BOLUS
INTRAVENOUS | Status: AC
  Filled 2015-08-21: qty 20

## 2015-08-21 MED ORDER — ONDANSETRON HCL 4 MG/2ML IJ SOLN
4.0000 mg | Freq: Four times a day (QID) | INTRAMUSCULAR | Status: DC | PRN
  Administered 2015-08-22: 4 mg via INTRAVENOUS
  Filled 2015-08-21: qty 2

## 2015-08-21 MED ORDER — ONDANSETRON HCL 4 MG PO TABS: 4.0000 mg | ORAL_TABLET | Freq: Four times a day (QID) | ORAL | Status: DC | PRN

## 2015-08-21 MED ORDER — FENTANYL CITRATE (PF) 100 MCG/2ML IJ SOLN
INTRAMUSCULAR | Status: DC | PRN
  Administered 2015-08-21: 50 ug via INTRAVENOUS

## 2015-08-21 MED ORDER — ONDANSETRON HCL 4 MG/2ML IJ SOLN
INTRAMUSCULAR | Status: DC | PRN
  Administered 2015-08-21: 4 mg via INTRAVENOUS

## 2015-08-21 MED ORDER — DEXAMETHASONE SODIUM PHOSPHATE 10 MG/ML IJ SOLN
INTRAMUSCULAR | Status: DC | PRN
  Administered 2015-08-21: 10 mg via INTRAVENOUS

## 2015-08-21 MED ORDER — CLINDAMYCIN PHOSPHATE 900 MG/50ML IV SOLN: 900.0000 mg | INTRAVENOUS | Status: DC

## 2015-08-21 MED ORDER — ACETAMINOPHEN 325 MG PO TABS: 650.0000 mg | ORAL_TABLET | Freq: Four times a day (QID) | ORAL | Status: DC | PRN

## 2015-08-21 MED ORDER — LACTATED RINGERS IV SOLN: INTRAVENOUS | Status: DC

## 2015-08-21 MED ORDER — METHOCARBAMOL 500 MG PO TABS: 500.0000 mg | ORAL_TABLET | Freq: Four times a day (QID) | ORAL | Status: DC | PRN

## 2015-08-21 MED ORDER — ALBUTEROL SULFATE (2.5 MG/3ML) 0.083% IN NEBU: 2.5000 mg | INHALATION_SOLUTION | Freq: Four times a day (QID) | RESPIRATORY_TRACT | Status: DC | PRN

## 2015-08-21 MED ORDER — MEPERIDINE HCL 25 MG/ML IJ SOLN: 6.2500 mg | INTRAMUSCULAR | Status: DC | PRN

## 2015-08-21 MED ORDER — BUPIVACAINE HCL (PF) 0.25 % IJ SOLN
INTRAMUSCULAR | Status: DC | PRN
  Administered 2015-08-21: 10 mL

## 2015-08-21 MED ORDER — METOCLOPRAMIDE HCL 5 MG/ML IJ SOLN: 5.0000 mg | Freq: Three times a day (TID) | INTRAMUSCULAR | Status: DC | PRN

## 2015-08-21 MED ORDER — EPHEDRINE SULFATE 50 MG/ML IJ SOLN
INTRAMUSCULAR | Status: DC | PRN
  Administered 2015-08-21: 10 mg via INTRAVENOUS

## 2015-08-21 SURGICAL SUPPLY — 55 items
BENZOIN TINCTURE PRP APPL 2/3 (GAUZE/BANDAGES/DRESSINGS) ×3 IMPLANT
CLOSURE WOUND 1/2 X4 (GAUZE/BANDAGES/DRESSINGS) ×1
COVER SURGICAL LIGHT HANDLE (MISCELLANEOUS) ×3 IMPLANT
DRAPE C-ARM 42X72 X-RAY (DRAPES) ×3 IMPLANT
DRAPE IMP U-DRAPE 54X76 (DRAPES) ×3 IMPLANT
DRAPE INCISE IOBAN 66X45 STRL (DRAPES) IMPLANT
DRAPE ORTHO SPLIT 77X108 STRL (DRAPES)
DRAPE SURG 17X23 STRL (DRAPES) ×3 IMPLANT
DRAPE SURG ORHT 6 SPLT 77X108 (DRAPES) IMPLANT
DRAPE U-SHAPE 47X51 STRL (DRAPES) ×3 IMPLANT
DRILL 2.6X220MM LONG AO (BIT) ×3 IMPLANT
DRSG AQUACEL AG ADV 3.5X 6 (GAUZE/BANDAGES/DRESSINGS) ×6 IMPLANT
DRSG EMULSION OIL 3X3 NADH (GAUZE/BANDAGES/DRESSINGS) IMPLANT
DURAPREP 26ML APPLICATOR (WOUND CARE) ×3 IMPLANT
ELECT REM PT RETURN 9FT ADLT (ELECTROSURGICAL) ×3
ELECTRODE REM PT RTRN 9FT ADLT (ELECTROSURGICAL) ×1 IMPLANT
GAUZE SPONGE 4X4 12PLY STRL (GAUZE/BANDAGES/DRESSINGS) IMPLANT
GLOVE BIO SURGEON STRL SZ8 (GLOVE) ×3 IMPLANT
GLOVE BIOGEL PI IND STRL 8 (GLOVE) ×1 IMPLANT
GLOVE BIOGEL PI INDICATOR 8 (GLOVE) ×2
GLOVE ORTHO TXT STRL SZ7.5 (GLOVE) ×3 IMPLANT
GOWN STRL REUS W/ TWL LRG LVL3 (GOWN DISPOSABLE) ×1 IMPLANT
GOWN STRL REUS W/ TWL XL LVL3 (GOWN DISPOSABLE) ×2 IMPLANT
GOWN STRL REUS W/TWL LRG LVL3 (GOWN DISPOSABLE) ×2
GOWN STRL REUS W/TWL XL LVL3 (GOWN DISPOSABLE) ×4
KIT BASIN OR (CUSTOM PROCEDURE TRAY) ×3 IMPLANT
KIT ROOM TURNOVER OR (KITS) ×3 IMPLANT
MANIFOLD NEPTUNE II (INSTRUMENTS) ×3 IMPLANT
NEEDLE HYPO 25GX1X1/2 BEV (NEEDLE) IMPLANT
NS IRRIG 1000ML POUR BTL (IV SOLUTION) ×3 IMPLANT
PACK SHOULDER (CUSTOM PROCEDURE TRAY) ×3 IMPLANT
PACK UNIVERSAL I (CUSTOM PROCEDURE TRAY) ×3 IMPLANT
PAD ARMBOARD 7.5X6 YLW CONV (MISCELLANEOUS) ×6 IMPLANT
PLATE CVD MIDSHAFT 6H LT (Plate) ×3 IMPLANT
SCREW BONE 3.5X12 (Screw) ×3 IMPLANT
SCREW BONE 3.5X14MM (Screw) ×3 IMPLANT
SCREW BONE 3.5X16MM (Screw) ×6 IMPLANT
SCREW LOCKING 14MM (Screw) ×3 IMPLANT
SCREW LOCKING 16MM (Screw) ×3 IMPLANT
SLING ARM FOAM STRAP MED (SOFTGOODS) ×3 IMPLANT
SPONGE LAP 18X18 X RAY DECT (DISPOSABLE) ×6 IMPLANT
STRIP CLOSURE SKIN 1/2X4 (GAUZE/BANDAGES/DRESSINGS) ×2 IMPLANT
SUCTION FRAZIER HANDLE 10FR (MISCELLANEOUS) ×2
SUCTION TUBE FRAZIER 10FR DISP (MISCELLANEOUS) ×1 IMPLANT
SUT ETHILON 3 0 PS 1 (SUTURE) ×3 IMPLANT
SUT MNCRL AB 4-0 PS2 18 (SUTURE) ×3 IMPLANT
SUT PROLENE 3 0 PS 1 (SUTURE) ×3 IMPLANT
SUT VIC AB 0 CT1 27 (SUTURE) ×2
SUT VIC AB 0 CT1 27XBRD ANBCTR (SUTURE) ×1 IMPLANT
SUT VIC AB 2-0 CT1 27 (SUTURE) ×2
SUT VIC AB 2-0 CT1 TAPERPNT 27 (SUTURE) ×1 IMPLANT
SUT VICRYL 0 CT 1 36IN (SUTURE) ×3 IMPLANT
SYR CONTROL 10ML LL (SYRINGE) IMPLANT
WATER STERILE IRR 1000ML POUR (IV SOLUTION) ×3 IMPLANT
YANKAUER SUCT BULB TIP NO VENT (SUCTIONS) ×3 IMPLANT

## 2015-08-21 NOTE — Op Note (Signed)
NAME:  Alexandria, Schneider NO.:  [id number]  MEDICAL RECORD NO.:  [id number]  LOCATION:  [id number]                        FACILITY:  [facility]  PHYSICIAN:  Vanita Panda. Magnus Ivan, M.D.DATE OF BIRTH:  Apr 02, 1980  DATE OF PROCEDURE:  08/21/2015 DATE OF DISCHARGE:                              OPERATIVE REPORT   PREOPERATIVE DIAGNOSIS:  Closed, displaced left midshaft clavicle fracture.  POSTOPERATIVE DIAGNOSIS:  Closed, displaced left midshaft clavicle fracture.  PROCEDURE:  Open reduction and internal fixation of left midshaft clavicle fracture.  IMPLANTS:  Stryker VariAx clavicle 6-hole superior clavicle plate and screws.  SURGEON:  Vanita Panda. Magnus Ivan, M.D.  ASSISTANT:  Richardean Canal, PA-C.  ANESTHESIA: 1. General. 2. Local with 0.25% plain Marcaine.  ANTIBIOTICS:  900 mg of IV clindamycin.  BLOOD LOSS:  Less than 50 mL.  COMPLICATIONS:  None.  INDICATIONS:  Alexandria Schneider is a very pleasant, right-hand dominant, 35 year old, who was camping 2 days ago on Sunday when she tripped over a tent cord and sustaining a blow directly to her midshaft clavicle.  She sustained a midshaft clavicle fracture on the left side and was placed in a sling and seen in Urgent Care.  However, her cat stepped on her this morning in bed and then she had more displacement of her fracture and severe pain.  She came to my office today and I recognized the displaced nature of the fracture, the medial side of its apex anterior angulation.  There was significant shortening as well and recommended she undergo open reduction and internal fixation of this fracture and we were proceeding with this surgery today due to the severity of her pain.  The risks and benefits of surgery were explained to her in detail and she did wish to proceed.  PROCEDURE DESCRIPTION:  After informed consent was obtained, appropriate left shoulder was marked.  She was brought to the operating room, placed supine on  the operating table, which was radiolucent operating table. General anesthesia was then obtained.  Her left clavicle area was prepped and draped with DuraPrep and sterile drapes.  Time-out was called and she was identified as correct patient and correct left clavicle.  I then made an incision over the clavicle and carried this proximally and distally.  We easily dissected the clavicle and found a transverse fracture of the clavicle, which we were able to reduce and get to interdigitate.  We then chose a superior Stryker 6-hole clavicle plate for a left clavicle and placed this on the superior border of the clavicle.  We secured this with bicortical screws and locking screws as well.  This stabilized the fracture and we verified the reduction also under direct fluoroscopy as well as visualization.  We then irrigated the soft tissue with normal saline solution using bulb syringe.  We closed the deep tissue over the plate with 0 Vicryl followed by 2-0 Vicryl in the subcutaneous tissue and 4-0 Monocryl subcuticular stitch. We infiltrated the incision with 0.25% plain Marcaine and then Steri- Strips were applied and well-padded sterile impervious dressing.  She was then awakened, extubated and taken to the recovery room in stable condition.  All final counts were correct.  There were no complications  noted.     Vanita Pandahristopher Y. Magnus IvanBlackman, M.D.     CYB/MEDQ  D:  08/21/2015  T:  08/21/2015  Job:  [id number]

## 2015-08-21 NOTE — Anesthesia Procedure Notes (Addendum)
Anesthesia Regional Block:  Interscalene brachial plexus block  Pre-Anesthetic Checklist: ,, timeout performed, Correct Patient, Correct Site, Correct Laterality, Correct Procedure, Correct Position, site marked, Risks and benefits discussed,  Surgical consent,  Pre-op evaluation,  At surgeon's request and post-op pain management  Laterality: Left  Prep: chloraprep       Needles:  Injection technique: Single-shot  Needle Type: Echogenic Stimulator Needle     Needle Length: 5cm 5 cm Needle Gauge: 22 and 22 G    Additional Needles:  Procedures: ultrasound guided (picture in chart) and nerve stimulator Interscalene brachial plexus block Narrative:  Injection made incrementally with aspirations every 5 mL.  Performed by: Personally  Anesthesiologist: Cecile HearingURK, STEPHEN EDWARD  Additional Notes: Functioning IV was confirmed and monitors were applied.  A 50mm 22ga Pajunk echogenic stimulator needle was used. Sterile prep and drape, hand hygiene and sterile gloves were used.  Negative aspiration and negative test dose prior to incremental administration of local anesthetic. The patient tolerated the procedure well.  Ultrasound guidance: relevent anatomy identified, needle position confirmed, local anesthetic spread visualized around nerve(s), vascular puncture avoided.  Image printed for medical record.    Procedure Name: Intubation Date/Time: 08/21/2015 5:43 PM Performed by: [name] PoundLOWDER, Analyn Matusek J Pre-anesthesia Checklist: Patient identified, Emergency Drugs available, Suction available, Patient being monitored and Timeout performed Patient Re-evaluated:Patient Re-evaluated prior to inductionOxygen Delivery Method: Circle system utilized Preoxygenation: Pre-oxygenation with 100% oxygen Intubation Type: IV induction Ventilation: Mask ventilation without difficulty Laryngoscope Size: Mac and 3 Grade View: Grade I Tube size: 7.0 mm Number of attempts: 1 Placement Confirmation: ETT inserted  through vocal cords under direct vision,  positive ETCO2 and breath sounds checked- equal and bilateral Secured at: 21 cm Tube secured with: Tape Dental Injury: Teeth and Oropharynx as per pre-operative assessment

## 2015-08-21 NOTE — Brief Op Note (Signed)
08/21/2015  6:52 PM  PATIENT:  Alexandria Schneider  35 y.o. female  PRE-OPERATIVE DIAGNOSIS:  left clavicle fracture  POST-OPERATIVE DIAGNOSIS:  left clavicle fracture  PROCEDURE:  Procedure(s): OPEN REDUCTION INTERNAL FIXATION (ORIF) LEFT CLAVICLE FRACTURE (Left)  SURGEON:  Surgeon(s) and Role:    * Kathryne Hitchhristopher Y Rolf Fells, MD - Primary  PHYSICIAN ASSISTANT: Rexene EdisonGil Clark, PA-C  ANESTHESIA:   local and general  EBL:   < 50 cc  COUNTS:  YES  DICTATION: .Other Dictation: Dictation Number [id number]  PLAN OF CARE: Admit for overnight observation  PATIENT DISPOSITION:  PACU - hemodynamically stable.   Delay start of Pharmacological VTE agent (>24hrs) due to surgical blood loss or risk of bleeding: no

## 2015-08-21 NOTE — Anesthesia Preprocedure Evaluation (Addendum)
Anesthesia Evaluation  Patient identified by MRN, date of birth, ID band Patient awake    Reviewed: Allergy & Precautions, NPO status , Patient's Chart, lab work & pertinent test results  History of Anesthesia Complications (+) PONV, PROLONGED EMERGENCE and history of anesthetic complications  Airway Mallampati: II  TM Distance: >3 FB Neck ROM: Full    Dental  (+) Teeth Intact, Dental Advisory Given   Pulmonary asthma , former smoker,    Pulmonary exam normal breath sounds clear to auscultation       Cardiovascular negative cardio ROS Normal cardiovascular exam Rhythm:Regular Rate:Normal     Neuro/Psych  Headaches, PSYCHIATRIC DISORDERS Depression    GI/Hepatic negative GI ROS, Neg liver ROS,   Endo/Other  Hyperthyroidism   Renal/GU Renal disease     Musculoskeletal negative musculoskeletal ROS (+)   Abdominal   Peds  Hematology  (+) Blood dyscrasia, anemia ,   Anesthesia Other Findings Day of surgery medications reviewed with the patient.  Reproductive/Obstetrics                             Anesthesia Physical Anesthesia Plan  ASA: II  Anesthesia Plan: General and Regional   Post-op Pain Management:  Regional for Post-op pain   Induction: Intravenous  Airway Management Planned: Oral ETT  Additional Equipment:   Intra-op Plan:   Post-operative Plan: Extubation in OR  Informed Consent: I have reviewed the patients History and Physical, chart, labs and discussed the procedure including the risks, benefits and alternatives for the proposed anesthesia with the patient or authorized representative who has indicated his/her understanding and acceptance.   Dental advisory given  Plan Discussed with: CRNA  Anesthesia Plan Comments: (Risks/benefits of general anesthesia discussed with patient including risk of damage to teeth, lips, gum, and tongue, nausea/vomiting, allergic  reactions to medications, and the possibility of heart attack, stroke and death.  All patient questions answered.  Patient wishes to proceed.)        Anesthesia Quick Evaluation

## 2015-08-21 NOTE — Transfer of Care (Signed)
Immediate Anesthesia Transfer of Care Note  Patient: Alexandria Schneider  Procedure(s) Performed: Procedure(s): OPEN REDUCTION INTERNAL FIXATION (ORIF) LEFT CLAVICLE FRACTURE (Left)  Patient Location: PACU  Anesthesia Type:GA combined with regional for post-op pain  Level of Consciousness: sedated and patient cooperative  Airway & Oxygen Therapy: Patient connected to nasal cannula oxygen  Post-op Assessment: Report given to RN and Post -op Vital signs reviewed and stable  Post vital signs: Reviewed and stable  Last Vitals:  Filed Vitals:   08/21/15 1630 08/21/15 1635  BP: 100/49 99/57  Pulse: 82 97  Temp:    Resp: 15 22    Last Pain:  Filed Vitals:   08/21/15 1911  PainSc: 5       Patients Stated Pain Goal: 2 (08/21/15 1516)  Complications: No apparent anesthesia complications

## 2015-08-21 NOTE — H&P (Signed)
Alexandria Schneider is an 35 y.o. female.   Chief Complaint:   Left clavicle shaft fracture HPI:   35 yo female who sustained a left clavicle fracture after a direct blow to the clavicle this past Sunday during a camping trip when she tripped.  X-rays show a displaced mid-shaft clavicle fracture with shortening and angulation.  She is having severe pain with this injury as well.  Past Medical History  Diagnosis Date  . Celiac disease   . Hashimoto disease   . Asthma   . Urolithiasis   . Proteinuria 2010  . Stone, kidney   . Hyperthyroidism     Hashimoto's  . Headache(784.0)     migraines  . Depression     as a teen  . Complication of anesthesia     slow to wake up  . Anemia   . Infection     UTI  . Ovarian cyst   . Vaginal Pap smear, abnormal     f/u was fine  . Endometriosis     Past Surgical History  Procedure Laterality Date  . Laparoscopic endometriosis fulguration    . Tonsillectomy    . Nasal sinus surgery    . Cesarean section    . Cesarean section  08/14/2011    Procedure: CESAREAN SECTION;  Surgeon: Hal Morales, MD;  Location: [facility];  Service: Gynecology;  Laterality: N/A;  Repeat  . Dilation and evacuation N/A 02/16/2014    Procedure: DILATATION AND EVACUATION;  Surgeon: Konrad Felix, MD;  Location: [facility];  Service: Gynecology;  Laterality: N/A;    Family History  Problem Relation Age of Onset  . Anesthesia problems Neg Hx   . Hypotension Neg Hx   . Malignant hyperthermia Neg Hx   . Pseudochol deficiency Neg Hx   . Heart disease Father     cardiac aneurysm  . Hypertension Father   . Emphysema Father   . COPD Father   . Alcohol abuse Father   . Kidney disease Maternal Grandmother   . Mental illness Maternal Grandmother   . Depression Maternal Grandmother   . Diabetes Maternal Grandmother   . Heart disease Paternal Grandmother   . Alcohol abuse Brother   . Heart disease Paternal Grandfather    Social History:  reports that she quit smoking  about 9 years ago. She has never used smokeless tobacco. She reports that she drinks about 0.6 oz of alcohol per week. She reports that she does not use illicit drugs.  Allergies:  Allergies  Allergen Reactions  . Fish Allergy Nausea And Vomiting  . Shrimp [Shellfish Allergy] Diarrhea  . Augmentin [Amoxicillin-Pot Clavulanate] Nausea And Vomiting  . Bextra [Valdecoxib] Nausea And Vomiting  . Gluten Other (See Comments)    Celiac disease  . Wheat Other (See Comments)    Patient states it makes her joints hurt and gives her migraines  . Morphine And Related Itching  . Sulfa Antibiotics Rash    No prescriptions prior to admission    No results found for this or any previous visit (from the past 48 hour(s)). No results found.  Review of Systems  All other systems reviewed and are negative.   unknown if currently breastfeeding. Physical Exam  Constitutional: She is oriented to person, place, and time. She appears well-developed and well-nourished.  HENT:  Head: Normocephalic and atraumatic.  Eyes: EOM are normal. Pupils are equal, round, and reactive to light.  Neck: Normal range of motion. Neck supple.  Cardiovascular: Normal  rate and regular rhythm.   Respiratory: Effort normal and breath sounds normal.  GI: Soft. Bowel sounds are normal.  Musculoskeletal:       Left shoulder: She exhibits decreased range of motion, bony tenderness, swelling and deformity.  Neurological: She is alert and oriented to person, place, and time.  Skin: Skin is warm and dry.  Psychiatric: She has a normal mood and affect.     Assessment/Plan Closed, displaced left clavicle shaft fracture with shortening 1)  To the OR today for open reduction/internal fixation of her left clavicle followed by overnight admission for pain control, nausea meds, and antibiotics.  Risks and benefits have been discussed in detail.  Kathryne HitchBLACKMAN,Dura Mccormack Y, MD 08/21/2015, 12:43 PM

## 2015-08-22 ENCOUNTER — Encounter (HOSPITAL_COMMUNITY): Payer: Self-pay | Admitting: Orthopaedic Surgery

## 2015-08-22 DIAGNOSIS — S42022A Displaced fracture of shaft of left clavicle, initial encounter for closed fracture: Secondary | ICD-10-CM | POA: Diagnosis not present

## 2015-08-22 MED ORDER — METHOCARBAMOL 500 MG PO TABS: 500.0000 mg | ORAL_TABLET | Freq: Four times a day (QID) | ORAL | Status: DC | PRN

## 2015-08-22 MED ORDER — OXYCODONE-ACETAMINOPHEN 5-325 MG PO TABS: 1.0000 | ORAL_TABLET | ORAL | Status: DC | PRN

## 2015-08-22 NOTE — H&P (Signed)
Patient has been mainly sleeping during night shift but arousable.  Noticed that patient had not voided.  Did warm compression to lower abdomin and [facility]  She voided only 50 ml. Did bladder scan; showed 450 mL.  Gave robaxin po 500 mg to assist in helping patient relax enough to void.  Her surgical block is still in effect to her left shoulder/arm.  She denies any pain at this time.

## 2015-08-22 NOTE — Discharge Instructions (Signed)
You can come out of your sling as comfort allows. No reaching overhead with you left arm. Ice as needed for swelling. You can get your dressing wet daily in the shower. You can leave your current dressing on until your outpatient follow-up.

## 2015-08-22 NOTE — Anesthesia Postprocedure Evaluation (Signed)
Anesthesia Post Note  Patient: Alexandria Schneider  Procedure(s) Performed: Procedure(s) (LRB): OPEN REDUCTION INTERNAL FIXATION (ORIF) LEFT CLAVICLE FRACTURE (Left)  Patient location during evaluation: PACU Anesthesia Type: General and Regional Level of consciousness: awake and alert Pain management: pain level controlled Vital Signs Assessment: post-procedure vital signs reviewed and stable Respiratory status: spontaneous breathing, nonlabored ventilation, respiratory function stable and patient connected to nasal cannula oxygen Cardiovascular status: blood pressure returned to baseline and stable Postop Assessment: no signs of nausea or vomiting Anesthetic complications: no    Last Vitals:  Filed Vitals:   08/21/15 2029 08/22/15 0300  BP: 92/57 95/52  Pulse: 71 105  Temp: 36.3 C 36.8 C  Resp: 13     Last Pain:  Filed Vitals:   08/22/15 0333  PainSc: 5                  Cecile HearingStephen Edward Elnor Renovato

## 2015-08-22 NOTE — Progress Notes (Signed)
Richrd HumblesJayn Briley to be D/C'd Home per MD order.  Discussed with the patient and all questions fully answered.  VSS, Skin clean, dry and intact without evidence of skin break down, no evidence of skin tears noted. IV catheter discontinued intact. Site without signs and symptoms of complications. Dressing and pressure applied.  An After Visit Summary was printed and given to the patient. Patient received prescription.  D/c education completed with patient including follow up instructions, medication list, d/c activities limitations if indicated, with other d/c instructions as indicated by MD - patient able to verbalize understanding, all questions fully answered.   Patient instructed to return to ED, call 911, or call MD for any changes in condition.   Patient escorted via WC, and D/C home via private auto.  Milas HockShatara Thomasena Vandenheuvel 08/22/2015 11:56 AM

## 2015-08-22 NOTE — Progress Notes (Signed)
Patient ID: Alexandria HumblesJayn Pondexter, female   DOB: [birth year], 35 y.o.   MRN: [id number] Comfortable this am.  Dressing clean and intact.  Can be discharged to home today.

## 2015-08-22 NOTE — Progress Notes (Signed)
Patient ambulated to bathroom with one assist and was able to void 475 mL of urine.

## 2015-08-22 NOTE — Discharge Summary (Signed)
Patient ID: Alexandria Schneider MRN: [id number] DOB/AGE: 1981/02/18 35 y.o.  Admit date: 08/21/2015 Discharge date: 08/22/2015  Admission Diagnoses:  Principal Problem:   Closed displaced fracture of left clavicle Active Problems:   Clavicle fracture, shaft   Discharge Diagnoses:  Same  Past Medical History  Diagnosis Date  . Celiac disease   . Hashimoto disease   . Asthma   . Urolithiasis   . Proteinuria 2010  . Stone, kidney   . Hyperthyroidism     Hashimoto's  . Headache(784.0)     migraines  . Depression     as a teen  . Anemia   . Infection     UTI  . Ovarian cyst   . Vaginal Pap smear, abnormal     f/u was fine  . Endometriosis   . Complication of anesthesia     slow to wake up  . PONV (postoperative nausea and vomiting)     Surgeries: Procedure(s): OPEN REDUCTION INTERNAL FIXATION (ORIF) LEFT CLAVICLE FRACTURE on 08/21/2015   Consultants:    Discharged Condition: Improved  Hospital Course: Alexandria Schneider is an 35 y.o. female who was admitted 08/21/2015 for operative treatment ofClosed displaced fracture of clavicle. Patient has severe unremitting pain that affects sleep, daily activities, and work/hobbies. After pre-op clearance the patient was taken to the operating room on 08/21/2015 and underwent  Procedure(s): OPEN REDUCTION INTERNAL FIXATION (ORIF) LEFT CLAVICLE FRACTURE.    Patient was given perioperative antibiotics: Anti-infectives    Start     Dose/Rate Route Frequency Ordered Stop   08/22/15 0600  clindamycin (CLEOCIN) IVPB 900 mg  Status:  Discontinued     900 mg 100 mL/hr over 30 Minutes Intravenous On call to O.R. 08/21/15 1447 08/21/15 2012   08/21/15 2130  clindamycin (CLEOCIN) IVPB 600 mg     600 mg 100 mL/hr over 30 Minutes Intravenous Every 6 hours 08/21/15 2023 08/22/15 1529   08/21/15 1457  clindamycin (CLEOCIN) 900 MG/50ML IVPB    Comments:  Forte, Lindsi   : cabinet override      08/21/15 1457 08/22/15 0259       Patient was given  sequential compression devices, early ambulation, and chemoprophylaxis to prevent DVT.  Patient benefited maximally from hospital stay and there were no complications.    Recent vital signs: Patient Vitals for the past 24 hrs:  BP Temp Temp src Pulse Resp SpO2 Height Weight  08/22/15 0300 (!) 95/52 mmHg 98.2 F (36.8 C) Oral (!) 105 - 96 % - -  08/21/15 2029 (!) 92/57 mmHg 97.4 F (36.3 C) Axillary 71 13 98 % - -  08/21/15 2000 100/73 mmHg 97.9 F (36.6 C) - 83 14 100 % - -  08/21/15 1945 108/69 mmHg - - 98 14 100 % - -  08/21/15 1930 103/67 mmHg - - (!) 117 15 100 % - -  08/21/15 1915 104/72 mmHg - - (!) 59 15 100 % - -  08/21/15 1909 102/62 mmHg 97.9 F (36.6 C) - 63 17 100 % - -  08/21/15 1635 (!) 99/57 mmHg - - 97 (!) 22 100 % - -  08/21/15 1630 (!) 100/49 mmHg - - 82 15 100 % - -  08/21/15 1625 (!) 98/51 mmHg - - 87 14 100 % - -  08/21/15 1620 (!) 92/52 mmHg - - 84 15 100 % - -  08/21/15 1616 96/60 mmHg - - 78 15 100 % - -  08/21/15 1611 (!) 148/116 mmHg - - 78  20 94 % - -  08/21/15 1610 - - - 76 12 100 % - -  08/21/15 1609 - - - 79 11 100 % - -  08/21/15 1608 - - - - 13 - - -  08/21/15 1607 - - - - (!) 21 - - -  08/21/15 1605 - - - - 14 - - -  08/21/15 1445 (!) 110/54 mmHg 98.6 F (37 C) Oral 73 18 100 % 5\' 9"  (1.753 m) 54.432 kg (120 lb)     Recent laboratory studies:  Recent Labs  08/21/15 1500  WBC 5.0  HGB 11.8*  HCT 37.3  PLT 222     Discharge Medications:     Medication List    STOP taking these medications        HYDROcodone-acetaminophen 5-325 MG tablet  Commonly known as:  NORCO/VICODIN     meloxicam 15 MG tablet  Commonly known as:  MOBIC     naproxen sodium 220 MG tablet  Commonly known as:  ANAPROX     traMADol 50 MG tablet  Commonly known as:  ULTRAM      TAKE these medications        acetaminophen 500 MG tablet  Commonly known as:  TYLENOL  Take 500-1,000 mg by mouth every 6 (six) hours as needed. For pain/headache      amphetamine-dextroamphetamine 30 MG 24 hr capsule  Commonly known as:  ADDERALL XR  Take 30 mg by mouth every morning.     methocarbamol 500 MG tablet  Commonly known as:  ROBAXIN  Take 1 tablet (500 mg total) by mouth every 6 (six) hours as needed for muscle spasms.     oxyCODONE-acetaminophen 5-325 MG tablet  Commonly known as:  ROXICET  Take 1-2 tablets by mouth every 4 (four) hours as needed.     PAPAYA ENZYME PO  Take 1 tablet by mouth daily as needed (digestive support).     PROAIR HFA 108 (90 Base) MCG/ACT inhaler  Generic drug:  albuterol  Inhale 1-2 puffs into the lungs every 6 (six) hours as needed for wheezing or shortness of breath.     TIROSINT 100 MCG Caps  Generic drug:  Levothyroxine Sodium  Take 100 mcg by mouth every morning.        Diagnostic Studies: Dg Clavicle Left  08/21/2015  CLINICAL DATA:  Post left clavicle repair EXAM: LEFT CLAVICLE - 2+ VIEWS; DG C-ARM 61-120 MIN COMPARISON:  None. FINDINGS: Three views of the left clavicle submitted. The patient is status post left clavicle fracture repair. A metallic fixation plate and metallic fixation screws are noted mid clavicle. There is anatomic alignment. Fluoroscopy time was 21 seconds.  Please see the operative report. IMPRESSION: Status post left clavicle fracture repair. Metallic fixation plate and metallic screws are noted mid aspect of the clavicle. There is anatomic alignment. Electronically Signed   By: Natasha MeadLiviu  Pop M.D.   On: 08/21/2015 20:09   Dg C-arm 1-60 Min  08/21/2015  CLINICAL DATA:  Post left clavicle repair EXAM: LEFT CLAVICLE - 2+ VIEWS; DG C-ARM 61-120 MIN COMPARISON:  None. FINDINGS: Three views of the left clavicle submitted. The patient is status post left clavicle fracture repair. A metallic fixation plate and metallic fixation screws are noted mid clavicle. There is anatomic alignment. Fluoroscopy time was 21 seconds.  Please see the operative report. IMPRESSION: Status post left clavicle  fracture repair. Metallic fixation plate and metallic screws are noted mid aspect of the clavicle. There  is anatomic alignment. Electronically Signed   By: Natasha Mead M.D.   On: 08/21/2015 20:09    Disposition: 01-Home or Self Care      Discharge Instructions    Call MD / Call 911    Complete by:  As directed   If you experience chest pain or shortness of breath, CALL 911 and be transported to the hospital emergency room.  If you develope a fever above 101 F, pus (white drainage) or increased drainage or redness at the wound, or calf pain, call your surgeon's office.     Constipation Prevention    Complete by:  As directed   Drink plenty of fluids.  Prune juice may be helpful.  You may use a stool softener, such as Colace (over the counter) 100 mg twice a day.  Use MiraLax (over the counter) for constipation as needed.     Diet - low sodium heart healthy    Complete by:  As directed      Discharge patient    Complete by:  As directed      Increase activity slowly as tolerated    Complete by:  As directed            Follow-up Information    Follow up with Kathryne Hitch, MD In 2 weeks.   Specialty:  Orthopedic Surgery   Contact information:   [address] [place] [address] [phone]        Signed: Kathryne Hitch 08/22/2015, 7:12 AM

## 2018-09-29 ENCOUNTER — Other Ambulatory Visit: Payer: Self-pay | Admitting: Family Medicine

## 2018-09-29 DIAGNOSIS — N644 Mastodynia: Secondary | ICD-10-CM

## 2018-10-06 ENCOUNTER — Ambulatory Visit
Admission: RE | Admit: 2018-10-06 | Discharge: 2018-10-06 | Disposition: A | Discharge status: Home / Self Care | Payer: BC Managed Care – PPO | Source: Ambulatory Visit | Attending: Family Medicine | Admitting: Family Medicine

## 2018-10-06 ENCOUNTER — Ambulatory Visit: Payer: BC Managed Care – PPO

## 2018-10-06 ENCOUNTER — Other Ambulatory Visit: Payer: Self-pay

## 2018-10-06 DIAGNOSIS — N644 Mastodynia: Secondary | ICD-10-CM

## 2019-08-24 ENCOUNTER — Other Ambulatory Visit: Payer: Self-pay | Admitting: Family Medicine

## 2019-08-24 DIAGNOSIS — Z1231 Encounter for screening mammogram for malignant neoplasm of breast: Secondary | ICD-10-CM

## 2019-10-18 ENCOUNTER — Other Ambulatory Visit: Payer: Self-pay

## 2019-10-18 ENCOUNTER — Ambulatory Visit
Admission: RE | Admit: 2019-10-18 | Discharge: 2019-10-18 | Disposition: A | Discharge status: Home / Self Care | Payer: PRIVATE HEALTH INSURANCE | Source: Ambulatory Visit

## 2019-10-18 DIAGNOSIS — Z1231 Encounter for screening mammogram for malignant neoplasm of breast: Secondary | ICD-10-CM

## 2021-06-23 ENCOUNTER — Other Ambulatory Visit: Payer: Self-pay

## 2021-06-23 ENCOUNTER — Emergency Department (HOSPITAL_COMMUNITY)
Admission: EM | Admit: 2021-06-23 | Discharge: 2021-06-24 | Disposition: A | Discharge status: Home / Self Care | Payer: BC Managed Care – PPO | Attending: Emergency Medicine | Admitting: Emergency Medicine

## 2021-06-23 ENCOUNTER — Encounter (HOSPITAL_COMMUNITY): Payer: Self-pay | Admitting: Emergency Medicine

## 2021-06-23 ENCOUNTER — Emergency Department (HOSPITAL_COMMUNITY): Payer: BC Managed Care – PPO

## 2021-06-23 DIAGNOSIS — Z79899 Other long term (current) drug therapy: Secondary | ICD-10-CM | POA: Insufficient documentation

## 2021-06-23 DIAGNOSIS — R000 Tachycardia, unspecified: Secondary | ICD-10-CM | POA: Insufficient documentation

## 2021-06-23 DIAGNOSIS — J45909 Unspecified asthma, uncomplicated: Secondary | ICD-10-CM | POA: Diagnosis not present

## 2021-06-23 LAB — CBC
HCT: 37.6 % (ref 36.0–46.0)
Hemoglobin: 12.1 g/dL (ref 12.0–15.0)
MCH: 27.1 pg (ref 26.0–34.0)
MCHC: 32.2 g/dL (ref 30.0–36.0)
MCV: 84.3 fL (ref 80.0–100.0)
Platelets: 329 10*3/uL (ref 150–400)
RBC: 4.46 MIL/uL (ref 3.87–5.11)
RDW: 15.6 % — ABNORMAL HIGH (ref 11.5–15.5)
WBC: 6 10*3/uL (ref 4.0–10.5)
nRBC: 0 % (ref 0.0–0.2)

## 2021-06-23 LAB — BASIC METABOLIC PANEL
Anion gap: 8 (ref 5–15)
BUN: 11 mg/dL (ref 6–20)
CO2: 23 mmol/L (ref 22–32)
Calcium: 9.7 mg/dL (ref 8.9–10.3)
Chloride: 105 mmol/L (ref 98–111)
Creatinine, Ser: 0.87 mg/dL (ref 0.44–1.00)
GFR, Estimated: >60 mL/min (ref >60)
Glucose, Bld: 114 mg/dL — ABNORMAL HIGH (ref 70–99)
Potassium: 4 mmol/L (ref 3.5–5.1)
Sodium: 136 mmol/L (ref 135–145)

## 2021-06-23 LAB — TROPONIN I (HIGH SENSITIVITY)
Troponin I (High Sensitivity): <2 ng/L (ref <18)
Troponin I (High Sensitivity): 3 ng/L (ref <18)

## 2021-06-23 LAB — I-STAT BETA HCG BLOOD, ED (MC, WL, AP ONLY): I-stat hCG, quantitative: <5 m[IU]/mL (ref <5)

## 2021-06-23 LAB — D-DIMER, QUANTITATIVE: D-Dimer, Quant: 0.32 ug/mL-FEU (ref 0.00–0.50)

## 2021-06-23 MED ORDER — SODIUM CHLORIDE 0.9 % IV BOLUS
500.0000 mL | Freq: Once | INTRAVENOUS | Status: AC
  Administered 2021-06-23: 500 mL via INTRAVENOUS

## 2021-06-23 MED ORDER — SODIUM CHLORIDE 0.9 % IV BOLUS
1000.0000 mL | Freq: Once | INTRAVENOUS | Status: AC
Start: 2021-06-23 — End: 2021-06-24
  Administered 2021-06-23: 1000 mL via INTRAVENOUS

## 2021-06-23 NOTE — ED Notes (Signed)
113/76 BP - 76 HR sitting ?93/70 BP - 80-107 HR Standing ? ?

## 2021-06-23 NOTE — ED Notes (Signed)
Pt unable to tolerate fluid bolus at 920mL/hr. Rate changed to 665mL/hr, pt more comfortable.  ?

## 2021-06-23 NOTE — ED Notes (Signed)
Horton DO at bedside to explain lab and diagnostic results to pt.  ?

## 2021-06-23 NOTE — ED Notes (Signed)
Pt resting in bed with husband at bedside, no complaints at this time. Plan of care continues. ?

## 2021-06-23 NOTE — ED Provider Notes (Signed)
?[facility] ?Provider Note ? ? ?CSN: [id number] ?Arrival date & time: 06/23/21  1714 ? ?  ? ?History ? ?Chief Complaint  ?Patient presents with  ? fast HR  ? ? ?Alexandria Schneider is a 41 y.o. female. ? ?HPI ? ?41 year old female with past medical history of Hashimoto's disease, asthma, kidney stones presents emergency department after an episode of tachycardia.  Patient states she has a history of kidney stone, currently being treated for UTI on Bactrim.  She is on day 3 of her regimen.  She was feeling unwell so she has been in bed all day today.  When she stands up she feels like her heart rate spikes and her Apple Watch was telling her that her heart rate was a high as 140s.  She denies any fever.  No nausea/vomiting/diarrhea.  Has been eating and drinking baseline on the Bactrim.  No symptoms of worsening flank pain/pyelonephritis.  She has no history of palpitations or cardiac disease.  She states that her Hashimoto's and thyroid disease has been well controlled. ? ?Home Medications ?Prior to Admission medications   ?Medication Sig Start Date End Date Taking? Authorizing Provider  ?acetaminophen (TYLENOL) 500 MG tablet Take 500-1,000 mg by mouth every 6 (six) hours as needed. For pain/headache   Yes [provider]  ?albuterol (VENTOLIN HFA) 108 (90 Base) MCG/ACT inhaler Inhale 1-2 puffs into the lungs every 6 (six) hours as needed for wheezing or shortness of breath.   Yes [provider]  ?amphetamine-dextroamphetamine (ADDERALL XR) 30 MG 24 hr capsule Take 30 mg by mouth every morning.  08/10/15  Yes [provider]  ?methocarbamol (ROBAXIN) 500 MG tablet Take 1 tablet (500 mg total) by mouth every 6 (six) hours as needed for muscle spasms. 08/22/15  Yes Mcarthur Rossetti, MD  ?Digestive Enzymes (PAPAYA ENZYME PO) Take 1 tablet by mouth daily as needed (digestive support).     [provider]  ?oxyCODONE-acetaminophen (ROXICET) 5-325  MG tablet Take 1-2 tablets by mouth every 4 (four) hours as needed. 08/22/15   Mcarthur Rossetti, MD  ?TIROSINT 100 MCG CAPS Take 100 mcg by mouth every morning.  08/15/15   [provider]  ?   ? ?Allergies    ?Fish allergy, Shrimp [shellfish allergy], Augmentin [amoxicillin-pot clavulanate], Bextra [valdecoxib], Gluten, Wheat, Morphine and related, and Sulfa antibiotics   ? ?Review of Systems   ?Review of Systems  ?Constitutional:  Positive for fatigue. Negative for fever.  ?Respiratory:  Negative for chest tightness and shortness of breath.   ?Cardiovascular:  Positive for palpitations. Negative for chest pain and leg swelling.  ?Gastrointestinal:  Negative for abdominal pain, diarrhea and vomiting.  ?Skin:  Negative for rash.  ?Neurological:  Negative for headaches.  ? ?Physical Exam ?Updated Vital Signs ?BP 112/81   Pulse 76   Temp 98.6 ?F (37 ?C) (Oral)   Resp 11   LMP 06/03/2021   SpO2 99%  ?Physical Exam ?Vitals and nursing note reviewed.  ?Constitutional:   ?   General: She is not in acute distress. ?   Appearance: Normal appearance. She is not diaphoretic.  ?HENT:  ?   Head: Normocephalic.  ?   Mouth/Throat:  ?   Mouth: Mucous membranes are moist.  ?Cardiovascular:  ?   Rate and Rhythm: Normal rate.  ?Pulmonary:  ?   Effort: Pulmonary effort is normal. No respiratory distress.  ?Abdominal:  ?   Palpations: Abdomen is soft.  ?  Tenderness: There is no abdominal tenderness.  ?Musculoskeletal:     ?   General: No swelling.  ?Skin: ?   General: Skin is warm.  ?Neurological:  ?   Mental Status: She is alert and oriented to person, place, and time. Mental status is at baseline.  ?Psychiatric:     ?   Mood and Affect: Mood normal.  ? ? ?ED Results / Procedures / Treatments   ?Labs ?(all labs ordered are listed, but only abnormal results are displayed) ?Labs Reviewed  ?BASIC METABOLIC PANEL - Abnormal; Notable for the following components:  ?    Result Value  ? Glucose, Bld 114 (*)   ? All other  components within normal limits  ?CBC - Abnormal; Notable for the following components:  ? RDW 15.6 (*)   ? All other components within normal limits  ?D-DIMER, QUANTITATIVE  ?I-STAT BETA HCG BLOOD, ED ([facility], [facility], [facility] ONLY)  ?TROPONIN I (HIGH SENSITIVITY)  ?TROPONIN I (HIGH SENSITIVITY)  ? ? ?EKG ?EKG Interpretation ? ?Date/Time:  Sunday June 23 2021 18:45:03 EDT ?Ventricular Rate:  83 ?PR Interval:  138 ?QRS Duration: 100 ?QT Interval:  364 ?QTC Calculation: 428 ?R Axis:   -33 ?Text Interpretation: Sinus rhythm LAD, consider left anterior fascicular block Low voltage, precordial leads Abnormal R-wave progression, early transition Confirmed by Lavenia Atlas [phone]) on 06/23/2021 7:15:00 PM ? ?Radiology ?DG Chest 2 View ? ?Result Date: 06/23/2021 ?CLINICAL DATA:  Tachycardia EXAM: CHEST - 2 VIEW COMPARISON:  None. FINDINGS: Heart size and mediastinal contours are within normal limits. No suspicious pulmonary opacities identified. No pleural effusion or pneumothorax visualized. No acute osseous abnormality appreciated. IMPRESSION: No acute intrathoracic process identified. Electronically Signed   By: Ofilia Neas M.D.   On: 06/23/2021 17:56   ? ?Procedures ?Procedures  ? ? ?Medications Ordered in ED ?Medications  ?sodium chloride 0.9 % bolus 500 mL (0 mLs Intravenous Stopped 06/23/21 2139)  ? ? ?ED Course/ Medical Decision Making/ A&P ?  ?                        ?Medical Decision Making ?Amount and/or Complexity of Data Reviewed ?Labs: ordered. ?Radiology: ordered. ? ? ?41 year old female presents emergency department episodes of tachycardia when standing up.  Currently being treated for UTI, on day 3 of Bactrim.  No fevers.  Has felt fatigued and stayed in bed all day.  Noticed her heart rate becoming high when standing up to use the restroom.   ? ?Vital signs are stable here, she is afebrile.  EKG is normal sinus rhythm.  Blood work is reassuring.  No leukocytosis, no anemia.  Normal kidney function, low suspicion  for pyelonephritis.  Troponins are negative x2 and D-dimer is normal. ? ?I suspect that the patient's positional tachycardia is most likely slight volume depletion, secondary to current infection/antibiotic usage.  Here on our orthostatic vitals her systolic changes just 20 points from sitting to standing, Tmax heart rate was 107.  Plan for further IV hydration and discharge home.  Patient otherwise appears well and has no complaints. ? ? ? ? ? ? ? ?Final Clinical Impression(s) / ED Diagnoses ?Final diagnoses:  ?None  ? ? ?Rx / DC Orders ?ED Discharge Orders   ? ? None  ? ?  ? ? ?  ?Lorelle Gibbs, DO ?06/23/21 2155 ? ?

## 2021-06-23 NOTE — ED Triage Notes (Signed)
Pt reports fast HR x 2 days.  States she had a syncopal episode 2 weeks ago and seen at another ED and diagnosed with a UTI.  Currently taking antibiotics.  States she just hasn't felt well the past couple of days. ?

## 2021-06-23 NOTE — Discharge Instructions (Addendum)
You have been seen and discharged from the emergency department.  Your blood work was normal.  No signs of spreading infection.  Your heart and lung work-up was normal.  The heart rate changing with position is most likely secondary to your body fighting an ongoing infection/antibiotic usage.  You were given IV fluids.  Stay well-hydrated and continue to take your current prescriptions as directed.  Follow-up with your primary provider for further evaluation and further care. Take home medications as prescribed. If you have any worsening symptoms or further concerns for your health please return to an emergency department for further evaluation. ? ?If tachycardia was to persist after your antibiotic/treatment is finished you may consider following up with your primary doctor and consider Holter monitoring. ?

## 2021-06-24 NOTE — ED Notes (Signed)
104/71 BP - 79HR laying ?106/73BP - 79-96Hr standing ?

## 2021-06-24 NOTE — ED Notes (Signed)
RN re-did orthostatic vitals on pt. Negative for orthostatics. Pt is more reassured and comfortable going home and following up with her primary care. Pt educated on slow position changes, as well as adequate hydration. Pt also educated on finishing her abx treatment for her UTI. Per Horton DO, RN discussed following up with PCP about wearing a Holter monitor. Pt in agreement and expressed understanding. VSS, pt A&Ox4, ambulatory on discharge.  ?

## 2021-07-31 IMAGING — MG DIGITAL SCREENING BILAT W/ TOMO W/ CAD
6 of 10 series · 6 of 30 positions shown · non-contrast
Comparison: Previous exam(s).

CLINICAL DATA: Screening.

EXAM:
DIGITAL SCREENING BILATERAL MAMMOGRAM WITH TOMO AND CAD

[R CC synth-2D (1 of 2)]
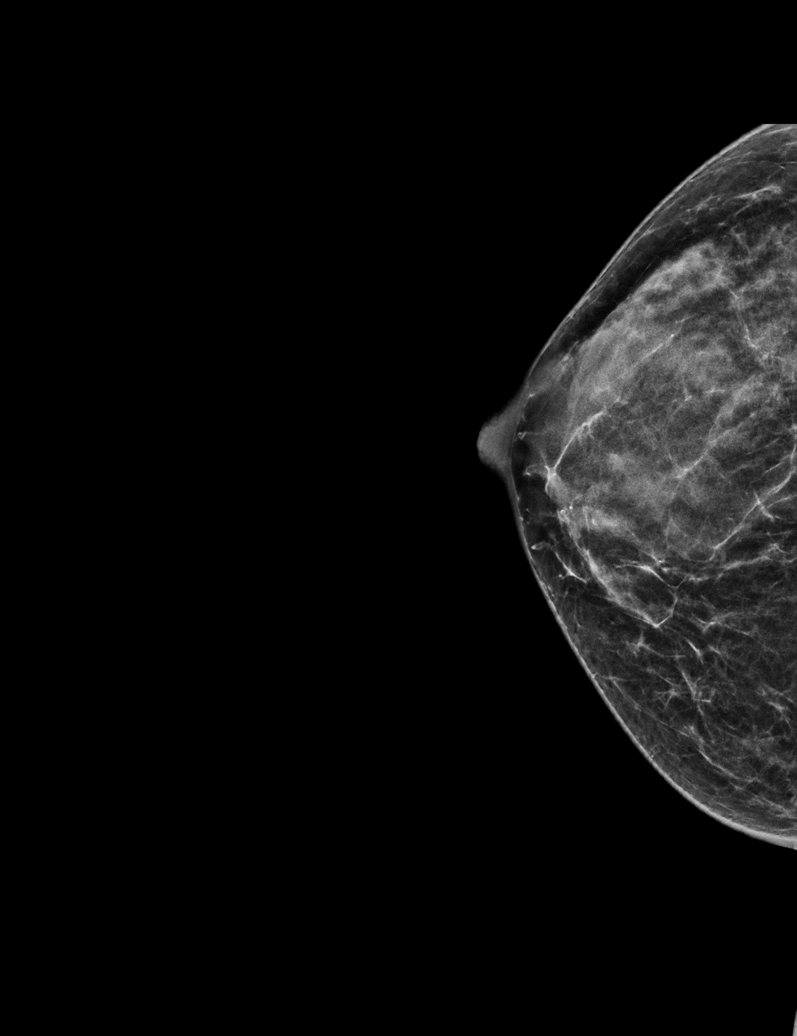

[L CC synth-2D]
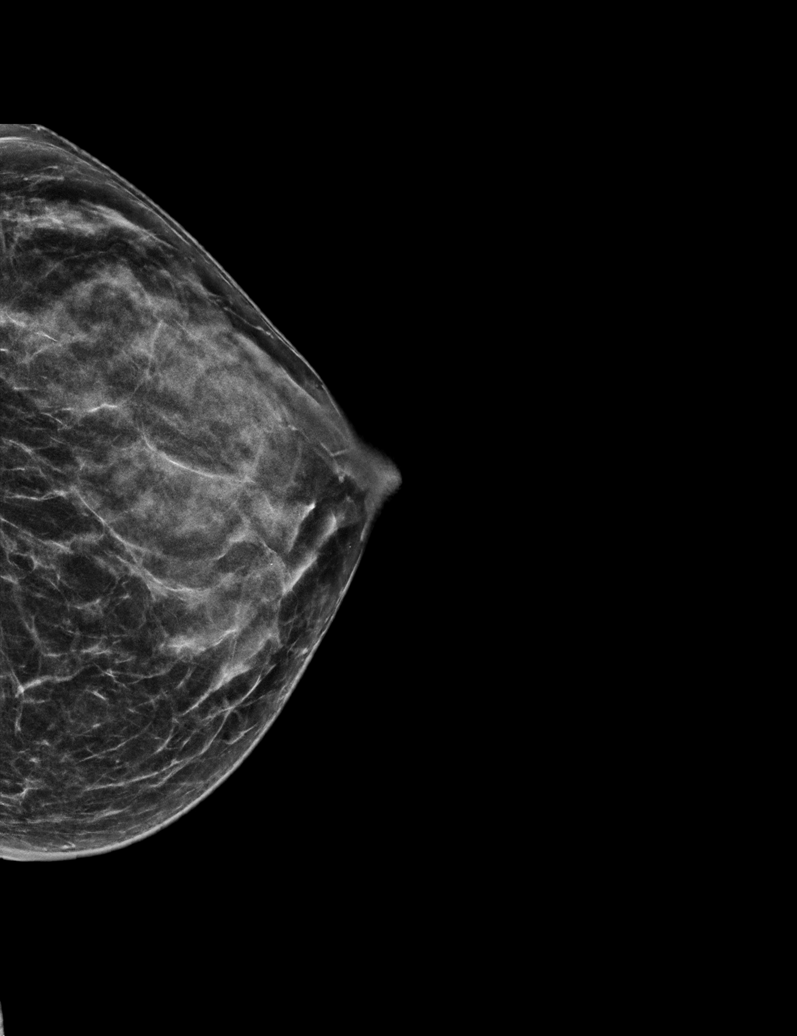

[R MLO synth-2D]
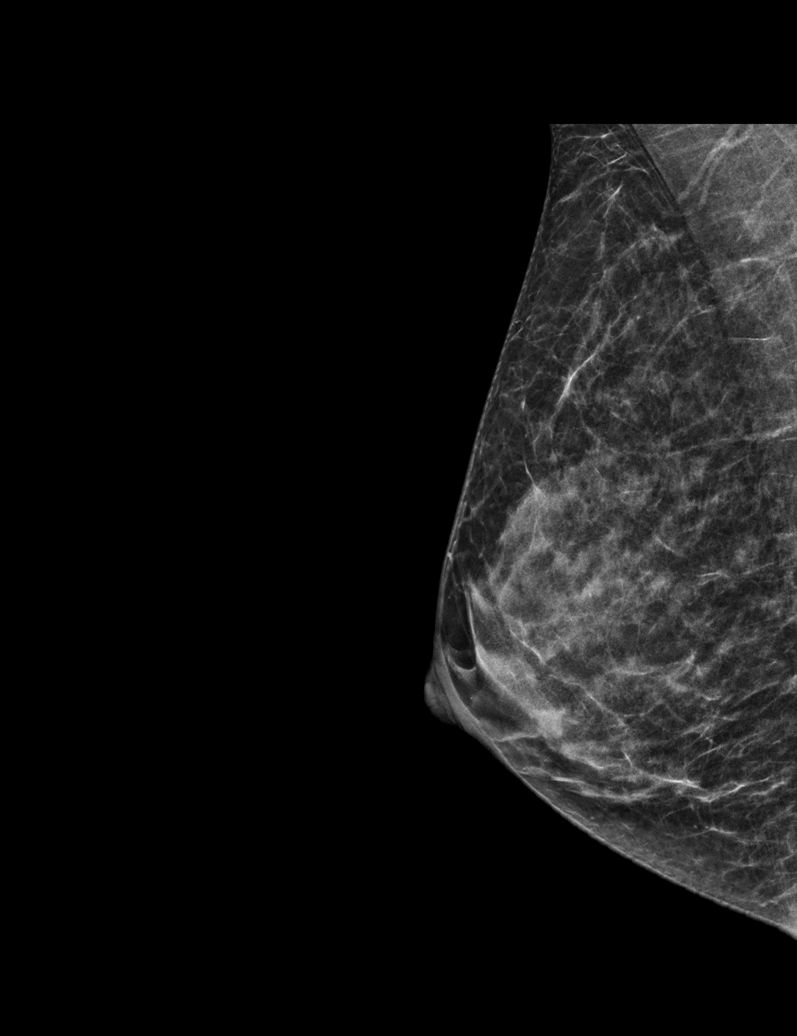

[L MLO synth-2D]
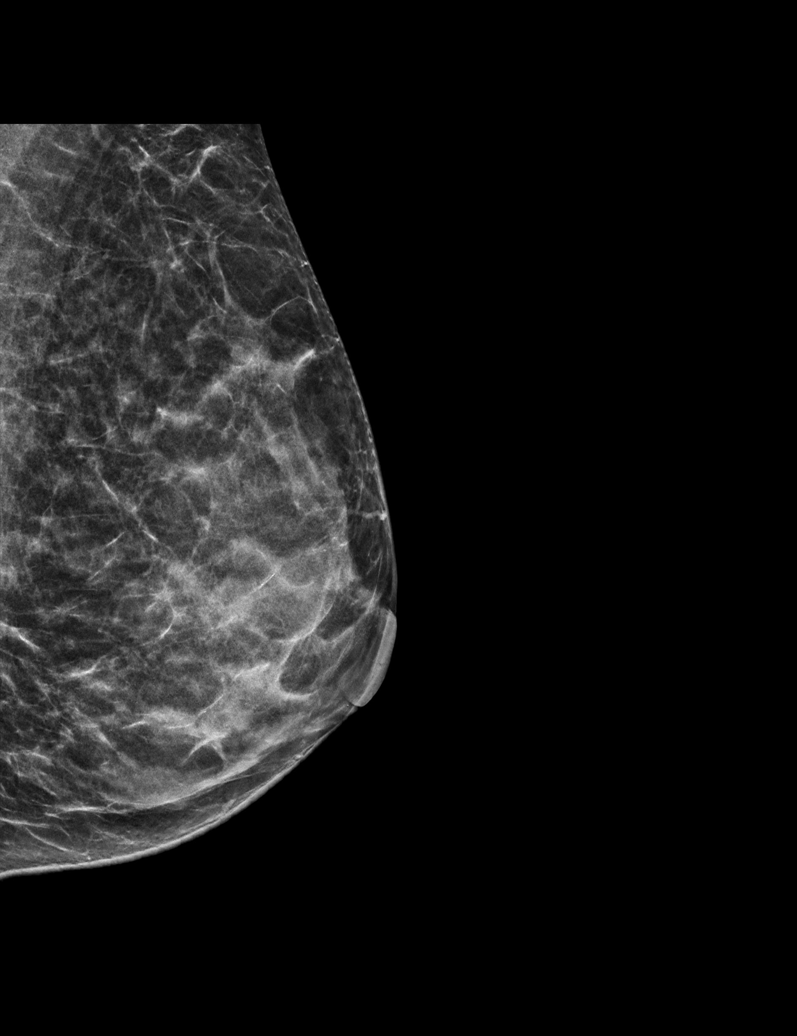

[R CC synth-2D (2 of 2)]
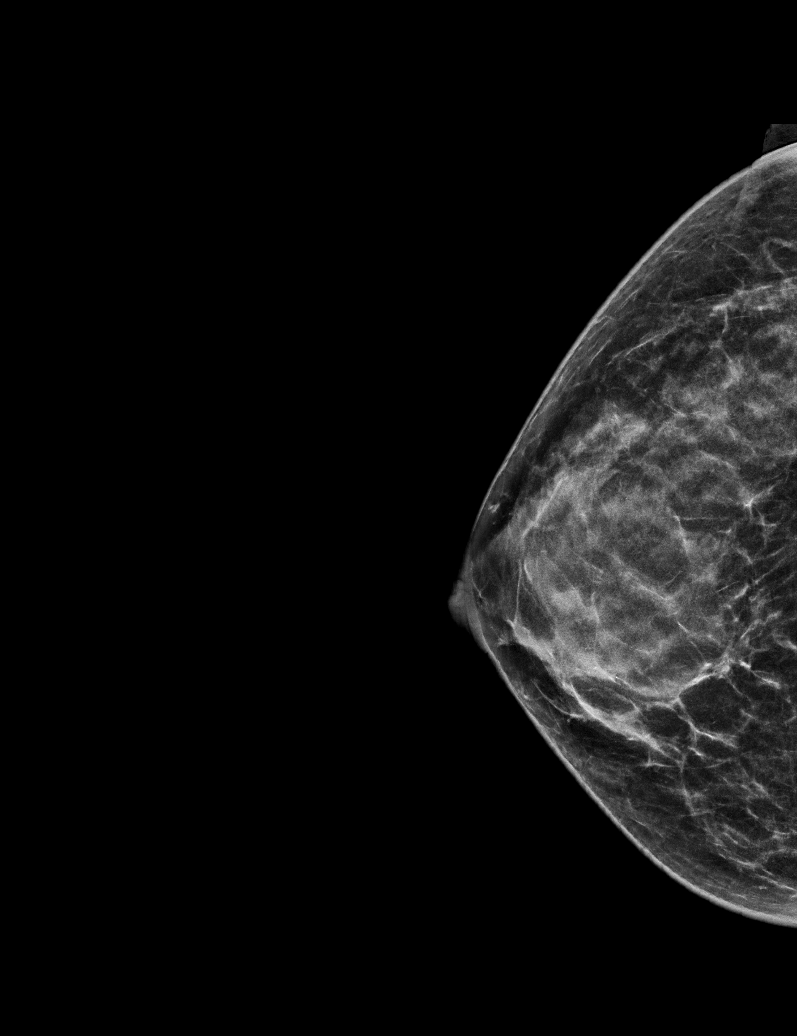

[R CC tomo · tomo slice 25/48.0]
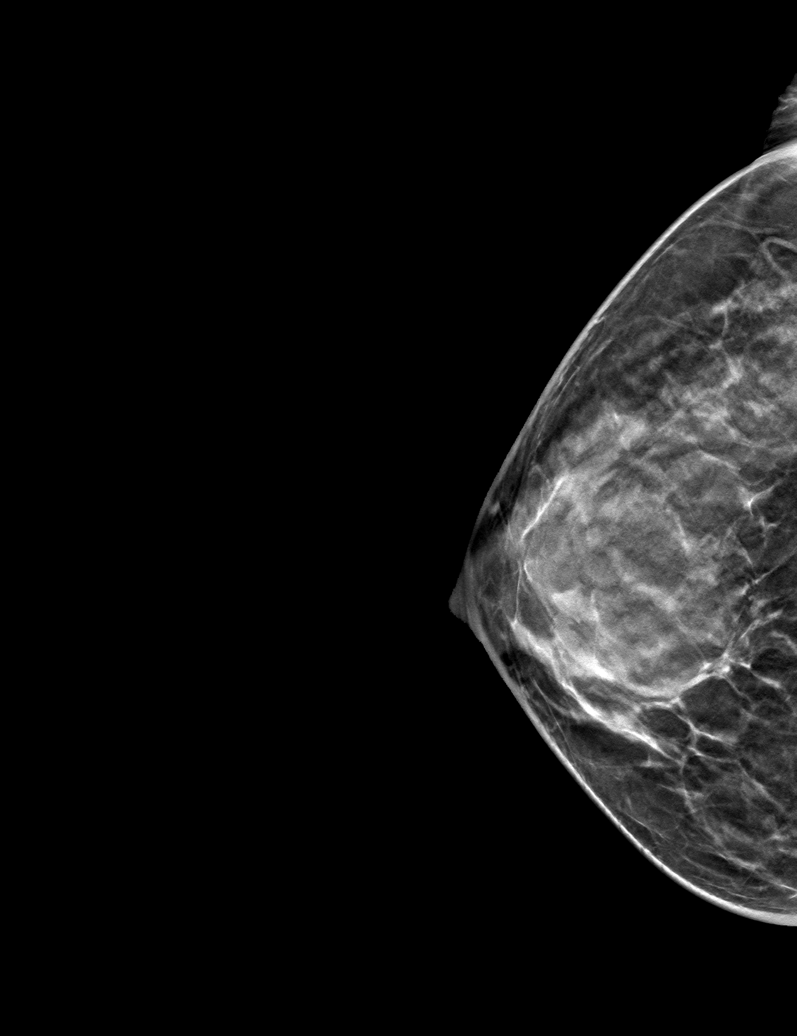

[6 of 30 positions shown; findings below may reference images not displayed]

ACR Breast Density Category c: The breast tissue is heterogeneously
dense, which may obscure small masses.
FINDINGS: There are no findings suspicious for malignancy. Images were
processed with CAD.
IMPRESSION: No mammographic evidence of malignancy. A result letter of this
screening mammogram will be mailed directly to the patient.

RECOMMENDATION:
Screening mammogram in one year. (Code:FT-U-LHB)

BI-RADS CATEGORY  1: Negative.

## 2021-11-06 ENCOUNTER — Other Ambulatory Visit: Payer: Self-pay | Admitting: Physician Assistant

## 2021-11-06 DIAGNOSIS — R519 Headache, unspecified: Secondary | ICD-10-CM

## 2021-11-06 DIAGNOSIS — Z9889 Other specified postprocedural states: Secondary | ICD-10-CM

## 2021-12-05 ENCOUNTER — Ambulatory Visit
Admission: RE | Admit: 2021-12-05 | Discharge: 2021-12-05 | Disposition: A | Discharge status: Home / Self Care | Payer: BC Managed Care – PPO | Source: Ambulatory Visit | Attending: Physician Assistant | Admitting: Physician Assistant

## 2021-12-05 DIAGNOSIS — R519 Headache, unspecified: Secondary | ICD-10-CM

## 2021-12-05 DIAGNOSIS — Z9889 Other specified postprocedural states: Secondary | ICD-10-CM

## 2021-12-06 ENCOUNTER — Other Ambulatory Visit: Payer: BC Managed Care – PPO

## 2022-02-27 ENCOUNTER — Other Ambulatory Visit (HOSPITAL_COMMUNITY): Payer: Self-pay | Admitting: Allergy and Immunology

## 2022-02-27 ENCOUNTER — Ambulatory Visit (HOSPITAL_COMMUNITY)
Admission: RE | Admit: 2022-02-27 | Discharge: 2022-02-27 | Disposition: A | Discharge status: Home / Self Care | Payer: BC Managed Care – PPO | Source: Ambulatory Visit | Attending: Allergy and Immunology | Admitting: Allergy and Immunology

## 2022-02-27 DIAGNOSIS — D806 Antibody deficiency with near-normal immunoglobulins or with hyperimmunoglobulinemia: Secondary | ICD-10-CM | POA: Insufficient documentation

## 2022-06-09 ENCOUNTER — Other Ambulatory Visit: Payer: Self-pay | Admitting: Urology

## 2022-06-09 DIAGNOSIS — N200 Calculus of kidney: Secondary | ICD-10-CM

## 2022-06-10 ENCOUNTER — Ambulatory Visit
Admission: RE | Admit: 2022-06-10 | Discharge: 2022-06-10 | Disposition: A | Discharge status: Home / Self Care | Payer: BC Managed Care – PPO | Source: Ambulatory Visit | Attending: Urology | Admitting: Urology

## 2022-06-10 DIAGNOSIS — N200 Calculus of kidney: Secondary | ICD-10-CM
